# Patient Record
Sex: Female | Born: 1975 | Race: White | Hispanic: No | Marital: Married | Smoking: Never smoker
Health system: Southern US, Community
[De-identification: ages and names within clinical notes are randomized; demographics above are authoritative.]

## PROBLEM LIST (undated history)

## (undated) DIAGNOSIS — N800 Endometriosis of uterus: Secondary | ICD-10-CM

## (undated) DIAGNOSIS — M51369 Other intervertebral disc degeneration, lumbar region without mention of lumbar back pain or lower extremity pain: Secondary | ICD-10-CM

## (undated) DIAGNOSIS — J45909 Unspecified asthma, uncomplicated: Secondary | ICD-10-CM

## (undated) DIAGNOSIS — M419 Scoliosis, unspecified: Secondary | ICD-10-CM

## (undated) DIAGNOSIS — M1990 Unspecified osteoarthritis, unspecified site: Secondary | ICD-10-CM

## (undated) DIAGNOSIS — J387 Other diseases of larynx: Secondary | ICD-10-CM

## (undated) DIAGNOSIS — IMO0001 Reserved for inherently not codable concepts without codable children: Secondary | ICD-10-CM

## (undated) DIAGNOSIS — R102 Pelvic and perineal pain: Secondary | ICD-10-CM

## (undated) DIAGNOSIS — Z90710 Acquired absence of both cervix and uterus: Secondary | ICD-10-CM

## (undated) DIAGNOSIS — M5136 Other intervertebral disc degeneration, lumbar region: Secondary | ICD-10-CM

## (undated) HISTORY — PX: MICROLARYNGOSCOPY WITH CO2 LASER AND EXCISION OF VOCAL CORD LESION: SHX5970

## (undated) HISTORY — DX: Scoliosis, unspecified: M41.9

## (undated) HISTORY — PX: CHOLECYSTECTOMY: SHX55

## (undated) HISTORY — DX: Other intervertebral disc degeneration, lumbar region without mention of lumbar back pain or lower extremity pain: M51.369

## (undated) HISTORY — DX: Unspecified osteoarthritis, unspecified site: M19.90

## (undated) HISTORY — DX: Other intervertebral disc degeneration, lumbar region: M51.36

---

## 2005-09-09 DIAGNOSIS — J387 Other diseases of larynx: Secondary | ICD-10-CM

## 2005-09-09 HISTORY — DX: Other diseases of larynx: J38.7

## 2007-02-20 ENCOUNTER — Ambulatory Visit: Payer: Self-pay | Admitting: Gastroenterology

## 2007-02-20 LAB — CONVERTED CEMR LAB
ALT: 20 units/L (ref 0–40)
Albumin: 3.3 g/dL — ABNORMAL LOW (ref 3.5–5.2)
Alkaline Phosphatase: 88 units/L (ref 39–117)

## 2007-02-23 ENCOUNTER — Ambulatory Visit (HOSPITAL_COMMUNITY): Admission: RE | Admit: 2007-02-23 | Discharge: 2007-02-23 | Payer: Self-pay | Admitting: Gastroenterology

## 2008-01-23 DIAGNOSIS — Z8719 Personal history of other diseases of the digestive system: Secondary | ICD-10-CM | POA: Insufficient documentation

## 2008-01-23 DIAGNOSIS — R945 Abnormal results of liver function studies: Secondary | ICD-10-CM

## 2011-01-22 NOTE — Assessment & Plan Note (Signed)
[facility]                         GASTROENTEROLOGY OFFICE NOTE   Ebony Espinoza, Ebony Espinoza                      MRN:          [id number]  DATE:02/20/2007                            DOB:          [birth year]    CHIEF COMPLAINT:  Ebony Espinoza is referred by Dr. Dimas Aguas for evaluation  of abnormal liver enzymes.   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 Gabriel Cirri in  March of this past year.  This was done laparoscopically, but I do not  have records for review at this time.  She apparently did well after  surgery.  Had an episode of rather severe epigastric pain with nausea in  mid May, was seen by her primary care physician and had markedly  elevated liver function tests with a bilirubin of 3.0, alkaline  phosphatase 368, SGOT 315, and SGPT 699.  Also at that time she was  having pruritus, clay colored stools and dark urine.  She had ultrasound  of the abdomen performed that showed fatty infiltration of the liver and  a prominent common bile duct with no change from March 2008.  She was  treated for urinary tract infection and subsequently got better.  She  is currently asymptomatic and denies abdominal pain, nausea or vomiting,  or any GI difficulties.  She has minor itching but denies chronic  fatigue.  She has never had hepatitis or prolonged viral illness, and is  not on any hepatotoxic drugs.  She does not use over-the-counter  medications.  Her appetite is good and her weight is stable.  She denies  associated collagen vascular type symptomatology such as skin rashes,  joint pains, photosensitivity,  etc.   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   MEDICATIONS:  Include:  1. Topamax 25 mg t.i.d.  2. Necon 777 daily for  birth control.  3. A medicine called Cyclobenzaprol 10 mg as needed for migraines.   In the past has had reactions to MORPHINE.   FAMILY HISTORY:  Remarkable for diabetes, but no known gastrointestinal  problems.   SOCIAL HISTORY:  She is married, lives with her husband and 2 children,  works as a housewife.  She has a high Photographer.  She does not  smoke or abuse ethanol.  She denies illicit IV drug use or needle  exposure.   REVIEW OF SYSTEMS:  Otherwise noncontributory.  Last menstrual period  February 11, 2007.   EXAM:  She is an attractive, healthy appearing white female in no  distress.  She is hoarse from recent vocal cord surgery.  She is 5 feet 4 inches tall and weighs 207 pounds.  Blood pressure  116/74, pulse was 76 and regular.  I could not appreciate stigmata of chronic liver disease and there was  no icterus.  CHEST:  Clear and she was in a regular rhythm without murmurs, gallops  or rubs.  I could  not appreciate hepatosplenomegaly, abdominal masses or  tenderness.  Bowel sounds were normal.  Peripheral extremities were  unremarkable.  There was no edema or  phlebitis.  Mental status was clear.   ASSESSMENT:  I think Ebony Espinoza probably passed retained common bile  duct stone per her clinical picture and abnormal liver function tests.  Her symptoms certainly do not sound like chronic liver disease or acute  hepatitis.  She did have acute viral serologies drawn on Jan 28, 2007,  these were all negative.   RECOMMENDATIONS:  1. Repeat liver profile.  2. MRCP exam.  3. Further workup depending on the above clinical evaluation.     Vania Rea. Jarold Motto, MD, Caleen Essex, FAGA  Electronically Signed    DRP/MedQ  DD: 02/20/2007  DT: 02/20/2007  Job #: [id number]   cc:   Patrice Paradise, M.D.

## 2014-10-25 ENCOUNTER — Encounter (HOSPITAL_COMMUNITY)
Admission: RE | Admit: 2014-10-25 | Discharge: 2014-10-25 | Disposition: A | Discharge status: Home / Self Care | Payer: BLUE CROSS/BLUE SHIELD | Source: Ambulatory Visit | Attending: Obstetrics and Gynecology | Admitting: Obstetrics and Gynecology

## 2014-10-25 ENCOUNTER — Encounter (HOSPITAL_COMMUNITY): Payer: Self-pay

## 2014-10-25 DIAGNOSIS — N800 Endometriosis of uterus: Secondary | ICD-10-CM | POA: Insufficient documentation

## 2014-10-25 DIAGNOSIS — R102 Pelvic and perineal pain: Secondary | ICD-10-CM | POA: Diagnosis not present

## 2014-10-25 DIAGNOSIS — Z01818 Encounter for other preprocedural examination: Secondary | ICD-10-CM | POA: Insufficient documentation

## 2014-10-25 HISTORY — DX: Other diseases of larynx: J38.7

## 2014-10-25 HISTORY — DX: Reserved for inherently not codable concepts without codable children: IMO0001

## 2014-10-25 HISTORY — DX: Unspecified asthma, uncomplicated: J45.909

## 2014-10-25 LAB — COMPREHENSIVE METABOLIC PANEL
ALBUMIN: 3.8 g/dL (ref 3.5–5.2)
ALK PHOS: 63 U/L (ref 39–117)
ALT: 14 U/L (ref 0–35)
ANION GAP: 4 — AB (ref 5–15)
AST: 13 U/L (ref 0–37)
BUN: 11 mg/dL (ref 6–23)
CALCIUM: 8.9 mg/dL (ref 8.4–10.5)
CO2: 21 mmol/L (ref 19–32)
Chloride: 112 mmol/L (ref 96–112)
Creatinine, Ser: 0.79 mg/dL (ref 0.50–1.10)
GFR calc Af Amer: >90 mL/min (ref >90)
GFR calc non Af Amer: >90 mL/min (ref >90)
Glucose, Bld: 85 mg/dL (ref 70–99)
POTASSIUM: 4 mmol/L (ref 3.5–5.1)
SODIUM: 137 mmol/L (ref 135–145)
TOTAL PROTEIN: 7.9 g/dL (ref 6.0–8.3)
Total Bilirubin: 0.6 mg/dL (ref 0.3–1.2)

## 2014-10-25 LAB — CBC
HCT: 40.8 % (ref 36.0–46.0)
Hemoglobin: 13.9 g/dL (ref 12.0–15.0)
MCH: 30 pg (ref 26.0–34.0)
MCHC: 34.1 g/dL (ref 30.0–36.0)
MCV: 88.1 fL (ref 78.0–100.0)
PLATELETS: 246 10*3/uL (ref 150–400)
RBC: 4.63 MIL/uL (ref 3.87–5.11)
RDW: 12.9 % (ref 11.5–15.5)
WBC: 8.7 10*3/uL (ref 4.0–10.5)

## 2014-10-25 NOTE — Patient Instructions (Signed)
Your procedure is scheduled on:10/31/14  Enter through the Main Entrance at :8am Pick up desk phone and dial [phone] and inform us of your arrival.  Please call [phone] if you have any problems the morning of surgery.  Remember: Do not eat food or drink liquids, including water, after midnight:Sunday   You may brush your teeth the morning of surgery.  Take these meds the morning of surgery with a sip of water:bring inhaler to hospital day of surgery  DO NOT wear jewelry, eye make-up, lipstick,body lotion, or dark fingernail polish.  (Polished toes are ok) You may wear deodorant.  If you are to be admitted after surgery, leave suitcase in car until your room has been assigned. Patients discharged on the day of surgery will not be allowed to drive home. Wear loose fitting, comfortable clothes for your ride home.

## 2014-10-29 ENCOUNTER — Encounter (HOSPITAL_COMMUNITY): Payer: Self-pay | Admitting: Obstetrics and Gynecology

## 2014-10-29 DIAGNOSIS — N8000 Endometriosis of the uterus, unspecified: Secondary | ICD-10-CM

## 2014-10-29 DIAGNOSIS — N8003 Adenomyosis of the uterus: Secondary | ICD-10-CM

## 2014-10-29 DIAGNOSIS — R102 Pelvic and perineal pain: Secondary | ICD-10-CM | POA: Diagnosis present

## 2014-10-29 HISTORY — DX: Endometriosis of the uterus, unspecified: N80.00

## 2014-10-29 HISTORY — DX: Endometriosis of uterus: N80.0

## 2014-10-29 HISTORY — DX: Adenomyosis of the uterus: N80.03

## 2014-10-29 HISTORY — DX: Pelvic and perineal pain: R10.2

## 2014-10-29 NOTE — H&P (Signed)
Ebony Espinoza is an 39 y.o. female G2P2 with pelvic pain, adenomyosis on pelvic US.  H/O irregular menses.  Pt on OCPs for treatment of pain without relief.  D/W pt r/b/a of hysterectomy, inc r/b/a - pt wishes to proceed.    Pertinent Gynecological History: Menses: flow is moderate and with severe dysmenorrhea Bleeding: nl menses Contraception: OCP (estrogen/progesterone) Blood transfusions: none Sexually transmitted diseases: no past history Previous GYN Procedures: none  Last mammogram: N/A   Last pap: normal Date: 2/15 OB History: G2, P2002   Menstrual History: No LMP recorded.    Past Medical History  Diagnosis Date  . Asthma   . Shortness of breath dyspnea     related to asthma  . 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 Uterus, adenomyosis 10/29/2014  . Pelvic pain in female 10/29/2014    Past Surgical History  Procedure Laterality Date  . Cholecystectomy    . Microlaryngoscopy with co2 laser and excision of vocal cord lesion      FH: cervical dysplasia, HTN, DM, prostate CA  Social History:  reports that she has never smoked. She does not have any smokeless tobacco history on file. She reports that she does not drink alcohol. Her drug history is not on file. no drug use, married, homemake  Allergies:  Allergies  Allergen Reactions  . Morphine And Related Itching  sulfa - rash    Meds: albuterol, Qvar, Dasetta, naproxen, topamax,  Review of Systems  Constitutional: Negative.   HENT: Negative.   Eyes: Negative.   Respiratory: Negative.   Cardiovascular: Negative.   Gastrointestinal: Positive for abdominal pain.  Genitourinary:       Pelvic pain, cramping  Musculoskeletal: Negative.   Skin: Negative.   Neurological: Negative.   Psychiatric/Behavioral: Negative.     There were no vitals taken for this visit. Physical Exam  Constitutional: She is oriented to person, place, and time. She appears well-developed and  well-nourished.  HENT:  Head: Normocephalic and atraumatic.  Cardiovascular: Normal rate and regular rhythm.   Respiratory: Effort normal and breath sounds normal. No respiratory distress. She has no wheezes.  GI: Soft. Bowel sounds are normal. She exhibits no distension. There is no tenderness.  Musculoskeletal: Normal range of motion.  Neurological: She is alert and oriented to person, place, and time.  Skin: Skin is warm and dry.  Psychiatric: She has a normal mood and affect. Her behavior is normal.   US - uterus c/w adenomyosis  Assessment/Plan: 01UX [id number] for LAVH/ B salpingectomy given h/o adenomyosis - have d/w pt r/b/a of surgery Post-op pain - dilaudid PCA Ancef for antibiotic prophylaxis   Bovard-Stuckert, Trung Wenzl 10/29/2014, 5:02 PM

## 2014-10-31 ENCOUNTER — Encounter (HOSPITAL_COMMUNITY): Payer: Self-pay | Admitting: *Deleted

## 2014-10-31 ENCOUNTER — Observation Stay (HOSPITAL_COMMUNITY)
Admission: RE | Admit: 2014-10-31 | Discharge: 2014-11-01 | Disposition: A | Discharge status: Home / Self Care | Payer: BLUE CROSS/BLUE SHIELD | Source: Ambulatory Visit | Attending: Obstetrics and Gynecology | Admitting: Obstetrics and Gynecology

## 2014-10-31 ENCOUNTER — Ambulatory Visit (HOSPITAL_COMMUNITY): Payer: BLUE CROSS/BLUE SHIELD | Admitting: Anesthesiology

## 2014-10-31 ENCOUNTER — Encounter (HOSPITAL_COMMUNITY)
Admission: RE | Disposition: A | Discharge status: Home / Self Care | Payer: Self-pay | Source: Ambulatory Visit | Attending: Obstetrics and Gynecology

## 2014-10-31 DIAGNOSIS — Z87448 Personal history of other diseases of urinary system: Secondary | ICD-10-CM | POA: Diagnosis not present

## 2014-10-31 DIAGNOSIS — Z885 Allergy status to narcotic agent status: Secondary | ICD-10-CM | POA: Insufficient documentation

## 2014-10-31 DIAGNOSIS — N8000 Endometriosis of the uterus, unspecified: Secondary | ICD-10-CM | POA: Diagnosis present

## 2014-10-31 DIAGNOSIS — Z90710 Acquired absence of both cervix and uterus: Secondary | ICD-10-CM

## 2014-10-31 DIAGNOSIS — Z882 Allergy status to sulfonamides status: Secondary | ICD-10-CM | POA: Insufficient documentation

## 2014-10-31 DIAGNOSIS — Z9049 Acquired absence of other specified parts of digestive tract: Secondary | ICD-10-CM | POA: Diagnosis not present

## 2014-10-31 DIAGNOSIS — N72 Inflammatory disease of cervix uteri: Principal | ICD-10-CM | POA: Insufficient documentation

## 2014-10-31 DIAGNOSIS — J45909 Unspecified asthma, uncomplicated: Secondary | ICD-10-CM | POA: Insufficient documentation

## 2014-10-31 DIAGNOSIS — R102 Pelvic and perineal pain: Secondary | ICD-10-CM | POA: Diagnosis present

## 2014-10-31 HISTORY — DX: Acquired absence of both cervix and uterus: Z90.710

## 2014-10-31 HISTORY — PX: LAPAROSCOPIC ASSISTED VAGINAL HYSTERECTOMY: SHX5398

## 2014-10-31 HISTORY — PX: BILATERAL SALPINGECTOMY: SHX5743

## 2014-10-31 HISTORY — DX: Endometriosis of uterus: N80.0

## 2014-10-31 HISTORY — DX: Pelvic and perineal pain: R10.2

## 2014-10-31 LAB — PREGNANCY, URINE: Preg Test, Ur: NEGATIVE

## 2014-10-31 SURGERY — HYSTERECTOMY, VAGINAL, LAPAROSCOPY-ASSISTED
Anesthesia: General | Site: Abdomen

## 2014-10-31 MED ORDER — LACTATED RINGERS IV SOLN
INTRAVENOUS | Status: DC
  Administered 2014-10-31 – 2014-11-01 (×3): via INTRAVENOUS

## 2014-10-31 MED ORDER — FENTANYL CITRATE 0.05 MG/ML IJ SOLN
INTRAMUSCULAR | Status: AC
  Filled 2014-10-31: qty 2

## 2014-10-31 MED ORDER — VASOPRESSIN 20 UNIT/ML IV SOLN
INTRAVENOUS | Status: AC
  Filled 2014-10-31: qty 1

## 2014-10-31 MED ORDER — NALBUPHINE HCL 10 MG/ML IJ SOLN
INTRAMUSCULAR | Status: AC
  Administered 2014-10-31: 2.5 mg via INTRAVENOUS
  Filled 2014-10-31: qty 1

## 2014-10-31 MED ORDER — NALBUPHINE HCL 10 MG/ML IJ SOLN
2.5000 mg | INTRAMUSCULAR | Status: AC
  Administered 2014-10-31 (×2): 2.5 mg via INTRAVENOUS
  Filled 2014-10-31 (×2): qty 1

## 2014-10-31 MED ORDER — ONDANSETRON HCL 4 MG/2ML IJ SOLN: 4.0000 mg | Freq: Four times a day (QID) | INTRAMUSCULAR | Status: DC | PRN

## 2014-10-31 MED ORDER — FENTANYL CITRATE 0.05 MG/ML IJ SOLN
INTRAMUSCULAR | Status: AC
  Filled 2014-10-31: qty 5

## 2014-10-31 MED ORDER — DIPHENHYDRAMINE HCL 50 MG/ML IJ SOLN
INTRAMUSCULAR | Status: AC
  Administered 2014-10-31: 12.5 mg via INTRAVENOUS
  Filled 2014-10-31: qty 1

## 2014-10-31 MED ORDER — NEOSTIGMINE METHYLSULFATE 10 MG/10ML IV SOLN
INTRAVENOUS | Status: AC
  Filled 2014-10-31: qty 1

## 2014-10-31 MED ORDER — FENTANYL CITRATE 0.05 MG/ML IJ SOLN
25.0000 ug | INTRAMUSCULAR | Status: DC | PRN
  Administered 2014-10-31 (×4): 50 ug via INTRAVENOUS

## 2014-10-31 MED ORDER — ALBUTEROL SULFATE HFA 108 (90 BASE) MCG/ACT IN AERS: 2.0000 | INHALATION_SPRAY | Freq: Four times a day (QID) | RESPIRATORY_TRACT | Status: DC | PRN

## 2014-10-31 MED ORDER — LACTATED RINGERS IR SOLN
Status: DC | PRN
  Administered 2014-10-31: 3000 mL

## 2014-10-31 MED ORDER — DIPHENHYDRAMINE HCL 50 MG/ML IJ SOLN
12.5000 mg | Freq: Four times a day (QID) | INTRAMUSCULAR | Status: DC | PRN
  Administered 2014-10-31: 12.5 mg via INTRAVENOUS

## 2014-10-31 MED ORDER — 0.9 % SODIUM CHLORIDE (POUR BTL) OPTIME
TOPICAL | Status: DC | PRN
  Administered 2014-10-31: 1000 mL

## 2014-10-31 MED ORDER — SODIUM CHLORIDE 0.9 % IJ SOLN: 9.0000 mL | INTRAMUSCULAR | Status: DC | PRN

## 2014-10-31 MED ORDER — MIDAZOLAM HCL 2 MG/2ML IJ SOLN
INTRAMUSCULAR | Status: AC
  Filled 2014-10-31: qty 2

## 2014-10-31 MED ORDER — ROCURONIUM BROMIDE 100 MG/10ML IV SOLN
INTRAVENOUS | Status: DC | PRN
  Administered 2014-10-31: 5 mg via INTRAVENOUS
  Administered 2014-10-31: 40 mg via INTRAVENOUS
  Administered 2014-10-31: 5 mg via INTRAVENOUS

## 2014-10-31 MED ORDER — HYDROMORPHONE 0.3 MG/ML IV SOLN
INTRAVENOUS | Status: DC
  Administered 2014-10-31: 1.19 mg via INTRAVENOUS
  Administered 2014-10-31: 13:00:00 via INTRAVENOUS
  Administered 2014-10-31: 0.2 mg via INTRAVENOUS
  Administered 2014-10-31 – 2014-11-01 (×2): 1.19 mg via INTRAVENOUS
  Filled 2014-10-31: qty 25

## 2014-10-31 MED ORDER — ONDANSETRON HCL 4 MG/2ML IJ SOLN
INTRAMUSCULAR | Status: AC
  Filled 2014-10-31: qty 2

## 2014-10-31 MED ORDER — HYDROMORPHONE HCL 1 MG/ML IJ SOLN
INTRAMUSCULAR | Status: AC
  Filled 2014-10-31: qty 1

## 2014-10-31 MED ORDER — DEXAMETHASONE SODIUM PHOSPHATE 10 MG/ML IJ SOLN
INTRAMUSCULAR | Status: AC
  Filled 2014-10-31: qty 1

## 2014-10-31 MED ORDER — DEXAMETHASONE SODIUM PHOSPHATE 10 MG/ML IJ SOLN
INTRAMUSCULAR | Status: DC | PRN
  Administered 2014-10-31: 10 mg via INTRAVENOUS

## 2014-10-31 MED ORDER — ALBUTEROL SULFATE (2.5 MG/3ML) 0.083% IN NEBU: 2.5000 mg | INHALATION_SOLUTION | Freq: Four times a day (QID) | RESPIRATORY_TRACT | Status: DC | PRN

## 2014-10-31 MED ORDER — VASOPRESSIN 20 UNIT/ML IV SOLN
INTRAVENOUS | Status: DC | PRN
  Administered 2014-10-31: 20 mL via INTRAMUSCULAR

## 2014-10-31 MED ORDER — GLYCOPYRROLATE 0.2 MG/ML IJ SOLN
INTRAMUSCULAR | Status: AC
  Filled 2014-10-31: qty 3

## 2014-10-31 MED ORDER — LIDOCAINE HCL (CARDIAC) 20 MG/ML IV SOLN
INTRAVENOUS | Status: AC
  Filled 2014-10-31: qty 5

## 2014-10-31 MED ORDER — BUPIVACAINE HCL (PF) 0.25 % IJ SOLN
INTRAMUSCULAR | Status: AC
  Filled 2014-10-31: qty 30

## 2014-10-31 MED ORDER — NEOSTIGMINE METHYLSULFATE 10 MG/10ML IV SOLN
INTRAVENOUS | Status: DC | PRN
  Administered 2014-10-31: 3 mg via INTRAVENOUS

## 2014-10-31 MED ORDER — SCOPOLAMINE 1 MG/3DAYS TD PT72
MEDICATED_PATCH | TRANSDERMAL | Status: AC
  Administered 2014-10-31: 1.5 mg via TRANSDERMAL
  Filled 2014-10-31: qty 1

## 2014-10-31 MED ORDER — LACTATED RINGERS IV SOLN: INTRAVENOUS | Status: DC

## 2014-10-31 MED ORDER — KETOROLAC TROMETHAMINE 30 MG/ML IJ SOLN
INTRAMUSCULAR | Status: AC
  Filled 2014-10-31: qty 1

## 2014-10-31 MED ORDER — PROPOFOL 10 MG/ML IV BOLUS
INTRAVENOUS | Status: DC | PRN
  Administered 2014-10-31: 150 mg via INTRAVENOUS
  Administered 2014-10-31: 50 mg via INTRAVENOUS

## 2014-10-31 MED ORDER — SCOPOLAMINE 1 MG/3DAYS TD PT72
1.0000 | MEDICATED_PATCH | Freq: Once | TRANSDERMAL | Status: DC
  Administered 2014-10-31: 1.5 mg via TRANSDERMAL

## 2014-10-31 MED ORDER — LIDOCAINE HCL (CARDIAC) 20 MG/ML IV SOLN
INTRAVENOUS | Status: DC | PRN
  Administered 2014-10-31: 80 mg via INTRAVENOUS

## 2014-10-31 MED ORDER — HYDROMORPHONE HCL 1 MG/ML IJ SOLN
INTRAMUSCULAR | Status: DC | PRN
  Administered 2014-10-31 (×2): 0.5 mg via INTRAVENOUS

## 2014-10-31 MED ORDER — MIDAZOLAM HCL 2 MG/2ML IJ SOLN
INTRAMUSCULAR | Status: DC | PRN
  Administered 2014-10-31: 2 mg via INTRAVENOUS

## 2014-10-31 MED ORDER — GLYCOPYRROLATE 0.2 MG/ML IJ SOLN
INTRAMUSCULAR | Status: DC | PRN
  Administered 2014-10-31: 0.6 mg via INTRAVENOUS

## 2014-10-31 MED ORDER — CEFAZOLIN SODIUM-DEXTROSE 2-3 GM-% IV SOLR
2.0000 g | INTRAVENOUS | Status: AC
  Administered 2014-10-31: 2 g via INTRAVENOUS

## 2014-10-31 MED ORDER — ACETAMINOPHEN 160 MG/5ML PO SOLN
1000.0000 mg | Freq: Four times a day (QID) | ORAL | Status: DC | PRN
  Administered 2014-10-31: 1000 mg via ORAL

## 2014-10-31 MED ORDER — SODIUM CHLORIDE 0.9 % IJ SOLN
INTRAMUSCULAR | Status: AC
  Filled 2014-10-31: qty 100

## 2014-10-31 MED ORDER — FLUTICASONE PROPIONATE HFA 44 MCG/ACT IN AERO
2.0000 | INHALATION_SPRAY | Freq: Two times a day (BID) | RESPIRATORY_TRACT | Status: DC
  Administered 2014-10-31 – 2014-11-01 (×2): 2 via RESPIRATORY_TRACT
  Filled 2014-10-31: qty 10.6

## 2014-10-31 MED ORDER — NALOXONE HCL 0.4 MG/ML IJ SOLN: 0.4000 mg | INTRAMUSCULAR | Status: DC | PRN

## 2014-10-31 MED ORDER — ONDANSETRON HCL 4 MG/2ML IJ SOLN
INTRAMUSCULAR | Status: DC | PRN
  Administered 2014-10-31: 4 mg via INTRAVENOUS

## 2014-10-31 MED ORDER — GUAIFENESIN 100 MG/5ML PO SOLN: 15.0000 mL | ORAL | Status: DC | PRN

## 2014-10-31 MED ORDER — ONDANSETRON HCL 4 MG PO TABS: 4.0000 mg | ORAL_TABLET | Freq: Four times a day (QID) | ORAL | Status: DC | PRN

## 2014-10-31 MED ORDER — DIPHENHYDRAMINE HCL 12.5 MG/5ML PO ELIX
12.5000 mg | ORAL_SOLUTION | Freq: Four times a day (QID) | ORAL | Status: DC | PRN
  Administered 2014-11-01: 12.5 mg via ORAL
  Filled 2014-10-31 (×2): qty 5

## 2014-10-31 MED ORDER — PROPOFOL 10 MG/ML IV BOLUS
INTRAVENOUS | Status: AC
  Filled 2014-10-31: qty 40

## 2014-10-31 MED ORDER — CETYLPYRIDINIUM CHLORIDE 0.05 % MT LIQD
7.0000 mL | Freq: Two times a day (BID) | OROMUCOSAL | Status: DC
  Administered 2014-10-31 – 2014-11-01 (×3): 7 mL via OROMUCOSAL

## 2014-10-31 MED ORDER — KETOROLAC TROMETHAMINE 30 MG/ML IJ SOLN
INTRAMUSCULAR | Status: DC | PRN
  Administered 2014-10-31: 30 mg via INTRAVENOUS

## 2014-10-31 MED ORDER — PNEUMOCOCCAL VAC POLYVALENT 25 MCG/0.5ML IJ INJ
0.5000 mL | INJECTION | INTRAMUSCULAR | Status: AC
Start: 2014-11-01 — End: 2014-11-01
  Administered 2014-11-01: 0.5 mL via INTRAMUSCULAR
  Filled 2014-10-31: qty 0.5

## 2014-10-31 MED ORDER — OXYCODONE-ACETAMINOPHEN 5-325 MG PO TABS
1.0000 | ORAL_TABLET | ORAL | Status: DC | PRN
  Administered 2014-11-01 (×2): 1 via ORAL
  Filled 2014-10-31 (×2): qty 1

## 2014-10-31 MED ORDER — NALBUPHINE HCL 10 MG/ML IJ SOLN
2.5000 mg | INTRAMUSCULAR | Status: DC
  Administered 2014-10-31 (×2): 2.5 mg via INTRAVENOUS

## 2014-10-31 MED ORDER — IBUPROFEN 800 MG PO TABS
800.0000 mg | ORAL_TABLET | Freq: Three times a day (TID) | ORAL | Status: DC | PRN
  Administered 2014-10-31 – 2014-11-01 (×2): 800 mg via ORAL
  Filled 2014-10-31 (×2): qty 1

## 2014-10-31 MED ORDER — MENTHOL 3 MG MT LOZG
1.0000 | LOZENGE | OROMUCOSAL | Status: DC | PRN
  Administered 2014-10-31: 3 mg via ORAL
  Filled 2014-10-31: qty 9

## 2014-10-31 MED ORDER — TOPIRAMATE 25 MG PO TABS
50.0000 mg | ORAL_TABLET | Freq: Two times a day (BID) | ORAL | Status: DC
  Administered 2014-10-31 – 2014-11-01 (×2): 50 mg via ORAL
  Filled 2014-10-31 (×3): qty 2

## 2014-10-31 MED ORDER — ACETAMINOPHEN 160 MG/5ML PO SOLN
ORAL | Status: AC
  Administered 2014-10-31: 1000 mg via ORAL
  Filled 2014-10-31: qty 40.6

## 2014-10-31 MED ORDER — LACTATED RINGERS IV SOLN
INTRAVENOUS | Status: DC
  Administered 2014-10-31 (×3): via INTRAVENOUS

## 2014-10-31 MED ORDER — ROCURONIUM BROMIDE 100 MG/10ML IV SOLN
INTRAVENOUS | Status: AC
  Filled 2014-10-31: qty 1

## 2014-10-31 MED ORDER — SIMETHICONE 80 MG PO CHEW: 80.0000 mg | CHEWABLE_TABLET | Freq: Four times a day (QID) | ORAL | Status: DC | PRN

## 2014-10-31 MED ORDER — FENTANYL CITRATE 0.05 MG/ML IJ SOLN
INTRAMUSCULAR | Status: DC | PRN
  Administered 2014-10-31 (×5): 50 ug via INTRAVENOUS
  Administered 2014-10-31: 100 ug via INTRAVENOUS

## 2014-10-31 MED ORDER — ALUM & MAG HYDROXIDE-SIMETH 200-200-20 MG/5ML PO SUSP: 30.0000 mL | ORAL | Status: DC | PRN

## 2014-10-31 MED ORDER — CEFAZOLIN SODIUM-DEXTROSE 2-3 GM-% IV SOLR
INTRAVENOUS | Status: AC
  Filled 2014-10-31: qty 50

## 2014-10-31 MED ORDER — FAMOTIDINE 20 MG PO TABS
20.0000 mg | ORAL_TABLET | Freq: Once | ORAL | Status: AC
  Administered 2014-10-31: 20 mg via ORAL

## 2014-10-31 MED ORDER — FAMOTIDINE 20 MG PO TABS
ORAL_TABLET | ORAL | Status: AC
  Administered 2014-10-31: 20 mg via ORAL
  Filled 2014-10-31: qty 1

## 2014-10-31 SURGICAL SUPPLY — 44 items
CABLE HIGH FREQUENCY MONO STRZ (ELECTRODE) IMPLANT
CLOSURE WOUND 1/4 X3 (GAUZE/BANDAGES/DRESSINGS)
CLOTH BEACON ORANGE TIMEOUT ST (SAFETY) ×4 IMPLANT
CONT PATH 16OZ SNAP LID 3702 (MISCELLANEOUS) ×4 IMPLANT
COVER BACK TABLE 60X90IN (DRAPES) ×4 IMPLANT
COVER LIGHT HANDLE  1/PK (MISCELLANEOUS) ×4
COVER LIGHT HANDLE 1/PK (MISCELLANEOUS) ×4 IMPLANT
DECANTER SPIKE VIAL GLASS SM (MISCELLANEOUS) ×4 IMPLANT
DRESSING OPSITE X SMALL 2X3 (GAUZE/BANDAGES/DRESSINGS) ×4 IMPLANT
DRSG COVADERM PLUS 2X2 (GAUZE/BANDAGES/DRESSINGS) ×8 IMPLANT
DRSG OPSITE POSTOP 3X4 (GAUZE/BANDAGES/DRESSINGS) IMPLANT
DURAPREP 26ML APPLICATOR (WOUND CARE) ×4 IMPLANT
ELECT REM PT RETURN 9FT ADLT (ELECTROSURGICAL) ×4
ELECTRODE REM PT RTRN 9FT ADLT (ELECTROSURGICAL) ×2 IMPLANT
EVACUATOR PREFILTER SMOKE (MISCELLANEOUS) ×4 IMPLANT
GLOVE BIO SURGEON STRL SZ 6.5 (GLOVE) ×3 IMPLANT
GLOVE BIO SURGEONS STRL SZ 6.5 (GLOVE) ×1
GLOVE BIOGEL PI IND STRL 7.0 (GLOVE) ×4 IMPLANT
GLOVE BIOGEL PI INDICATOR 7.0 (GLOVE) ×4
LIQUID BAND (GAUZE/BANDAGES/DRESSINGS) ×4 IMPLANT
NEEDLE INSUFFLATION 120MM (ENDOMECHANICALS) ×4 IMPLANT
NS IRRIG 1000ML POUR BTL (IV SOLUTION) ×4 IMPLANT
PACK LAVH (CUSTOM PROCEDURE TRAY) ×4 IMPLANT
PACK ROBOTIC GOWN (GOWN DISPOSABLE) ×4 IMPLANT
PAD POSITIONER PINK NONSTERILE (MISCELLANEOUS) ×4 IMPLANT
PROTECTOR NERVE ULNAR (MISCELLANEOUS) IMPLANT
SET CYSTO W/LG BORE CLAMP LF (SET/KITS/TRAYS/PACK) IMPLANT
SET IRRIG TUBING LAPAROSCOPIC (IRRIGATION / IRRIGATOR) IMPLANT
SET TRI-LUMEN FLTR TB AIRSEAL (TUBING) ×4 IMPLANT
SHEARS HARMONIC ACE PLUS 36CM (ENDOMECHANICALS) ×4 IMPLANT
STRIP CLOSURE SKIN 1/4X3 (GAUZE/BANDAGES/DRESSINGS) IMPLANT
SUT VIC AB 1 CT1 18XBRD ANBCTR (SUTURE) ×4 IMPLANT
SUT VIC AB 1 CT1 8-18 (SUTURE) ×8
SUT VIC AB 2-0 CT1 (SUTURE) ×4 IMPLANT
SUT VICRYL 0 TIES 12 18 (SUTURE) ×4 IMPLANT
SUT VICRYL 0 UR6 27IN ABS (SUTURE) ×4 IMPLANT
SUT VICRYL 4-0 PS2 18IN ABS (SUTURE) ×4 IMPLANT
SYR 3ML 23GX1 SAFETY (SYRINGE) ×4 IMPLANT
TOWEL OR 17X24 6PK STRL BLUE (TOWEL DISPOSABLE) ×16 IMPLANT
TRAY FOLEY CATH 14FR (SET/KITS/TRAYS/PACK) ×4 IMPLANT
TROCAR PORT AIRSEAL 5X120 (TROCAR) ×4 IMPLANT
TROCAR XCEL NON-BLD 5MMX100MML (ENDOMECHANICALS) ×12 IMPLANT
WARMER LAPAROSCOPE (MISCELLANEOUS) ×4 IMPLANT
WATER STERILE IRR 1000ML POUR (IV SOLUTION) ×4 IMPLANT

## 2014-10-31 NOTE — Interval H&P Note (Signed)
History and Physical Interval Note:  10/31/2014 8:40 AM  Ebony Espinoza  has presented today for surgery, with the diagnosis of Pelvic Pain, Adenomyosis,   The various methods of treatment have been discussed with the patient and family. After consideration of risks, benefits and other options for treatment, the patient has consented to  Procedure(s): LAPAROSCOPIC ASSISTED VAGINAL HYSTERECTOMY (N/A) BILATERAL SALPINGECTOMY (Bilateral) as a surgical intervention .  The patient's history has been reviewed, patient examined, no change in status, stable for surgery.  I have reviewed the patient's chart and labs.  Questions were answered to the patient's satisfaction.     Bovard-Stuckert, Yulia Ulrich

## 2014-10-31 NOTE — Progress Notes (Signed)
Day of Surgery Procedure(s) (LRB): LAPAROSCOPIC ASSISTED VAGINAL HYSTERECTOMY (N/A) BILATERAL SALPINGECTOMY (Bilateral)  Subjective: Patient reports tolerating PO.  Some pain, but controlling with PCA  Objective: I have reviewed patient's vital signs and intake and output.  General: alert and cooperative GI: soft NT  Assessment: s/p Procedure(s) with comments: LAPAROSCOPIC ASSISTED VAGINAL HYSTERECTOMY (N/A) - vaginal  BILATERAL SALPINGECTOMY (Bilateral) - vaginal: stable  Plan: Doing well immediately post-op Tolerating po well.     Oliver Pila 10/31/2014, 6:43 PM

## 2014-10-31 NOTE — Anesthesia Preprocedure Evaluation (Signed)
Anesthesia Evaluation  Patient identified by MRN, date of birth, ID band Patient awake    Reviewed: Allergy & Precautions, H&P , Patient's Chart, lab work & pertinent test results, reviewed documented beta blocker date and time   Airway Mallampati: II  TM Distance: >3 FB Neck ROM: full    Dental no notable dental hx.    Pulmonary asthma ,  breath sounds clear to auscultation  Pulmonary exam normal       Cardiovascular Rhythm:regular Rate:Normal     Neuro/Psych    GI/Hepatic   Endo/Other    Renal/GU      Musculoskeletal   Abdominal   Peds  Hematology   Anesthesia Other Findings   Reproductive/Obstetrics                             Anesthesia Physical Anesthesia Plan  ASA: II  Anesthesia Plan: General   Post-op Pain Management:    Induction: Intravenous  Airway Management Planned: Oral ETT  Additional Equipment:   Intra-op Plan:   Post-operative Plan: Extubation in OR  Informed Consent: I have reviewed the patients History and Physical, chart, labs and discussed the procedure including the risks, benefits and alternatives for the proposed anesthesia with the patient or authorized representative who has indicated his/her understanding and acceptance.   Dental Advisory Given and Dental advisory given  Plan Discussed with: CRNA and Surgeon  Anesthesia Plan Comments: (Will use slightly smaller ETT  Discussed general anesthesia, including possible nausea, instrumentation of airway, sore throat,pulmonary aspiration, etc. I asked if the were any outstanding questions, or  concerns before we proceeded. )        Anesthesia Quick Evaluation

## 2014-10-31 NOTE — Brief Op Note (Signed)
10/31/2014  10:59 AM  PATIENT:  Ebony Espinoza  39 y.o. female  PRE-OPERATIVE DIAGNOSIS:  Pelvic Pain, Adenomyosis,   POST-OPERATIVE DIAGNOSIS:  Pelvic Pain, Adenomyosis,   PROCEDURE:  Procedure(s) with comments: LAPAROSCOPIC ASSISTED VAGINAL HYSTERECTOMY (N/A) - vaginal  BILATERAL SALPINGECTOMY (Bilateral) - vaginal  SURGEON:  Surgeon(s) and Role:    * Sherian Rein, MD - Primary    * Lavina Hamman, MD - Assisting  ANESTHESIA:   local and general  EBL:  Total I/O In: 2000 [I.V.:2000] Out: 350 [Urine:150; Blood:200]  BLOOD ADMINISTERED:none  DRAINS: Urinary Catheter (Foley)   LOCAL MEDICATIONS USED:  MARCAINE     SPECIMEN:  Source of Specimen:  uterus, cervix, B tubes  DISPOSITION OF SPECIMEN:  PATHOLOGY  COUNTS:  YES  TOURNIQUET:  * No tourniquets in log *  DICTATION: .Other Dictation: Dictation Number [id number]  PLAN OF CARE: Admit for overnight observation  PATIENT DISPOSITION:  PACU - hemodynamically stable.   Delay start of Pharmacological VTE agent (>24hrs) due to surgical blood loss or risk of bleeding: not applicable

## 2014-10-31 NOTE — Anesthesia Postprocedure Evaluation (Signed)
  Anesthesia Post-op Note  Patient: Ebony Espinoza  Procedure(s) Performed: Procedure(s) with comments: LAPAROSCOPIC ASSISTED VAGINAL HYSTERECTOMY (N/A) - vaginal  BILATERAL SALPINGECTOMY (Bilateral) - vaginal Patient is awake and responsive. Pain and nausea are reasonably well controlled. Vital signs are stable and clinically acceptable. Oxygen saturation is clinically acceptable. There are no apparent anesthetic complications at this time. Patient is ready for discharge.

## 2014-10-31 NOTE — Anesthesia Postprocedure Evaluation (Signed)
  Anesthesia Post-op Note  Patient: Ebony Espinoza  Procedure(s) Performed: Procedure(s) with comments: LAPAROSCOPIC ASSISTED VAGINAL HYSTERECTOMY (N/A) - vaginal  BILATERAL SALPINGECTOMY (Bilateral) - vaginal  Patient Location: PACU and Women's Unit  Anesthesia Type:General  Level of Consciousness: awake, alert , oriented and patient cooperative  Airway and Oxygen Therapy: Patient Spontanous Breathing  Post-op Pain: none  Post-op Assessment: Post-op Vital signs reviewed, Patient's Cardiovascular Status Stable, Respiratory Function Stable, Patent Airway, No signs of Nausea or vomiting, Adequate PO intake, Pain level controlled, No headache, No backache, No residual numbness and No residual motor weakness  Post-op Vital Signs: Reviewed and stable  Last Vitals:  Filed Vitals:   10/31/14 1550  BP: 121/62  Pulse: 68  Temp: 36.4 C  Resp: 15    Complications: No apparent anesthesia complications

## 2014-10-31 NOTE — Transfer of Care (Signed)
Immediate Anesthesia Transfer of Care Note  Patient: Josetta Huddle  Procedure(s) Performed: Procedure(s) with comments: LAPAROSCOPIC ASSISTED VAGINAL HYSTERECTOMY (N/A) - vaginal  BILATERAL SALPINGECTOMY (Bilateral) - vaginal  Patient Location: PACU  Anesthesia Type:General  Level of Consciousness: awake, alert  and patient cooperative  Airway & Oxygen Therapy: Patient Spontanous Breathing and Patient connected to nasal cannula oxygen  Post-op Assessment: Report given to RN and Post -op Vital signs reviewed and stable  Post vital signs: Reviewed and stable  Last Vitals:  Filed Vitals:   10/31/14 0755  BP: 122/59  Pulse: 62  Temp: 36.7 C  Resp: 16    Complications: No apparent anesthesia complications

## 2014-10-31 NOTE — Progress Notes (Signed)
Report called at 1249 to Arnold Long, RN.

## 2014-10-31 NOTE — Addendum Note (Signed)
Addendum  created 10/31/14 1707 by Orlie Pollen, CRNA   Modules edited: Notes Section   Notes Section:  File: [id number]

## 2014-11-01 ENCOUNTER — Encounter (HOSPITAL_COMMUNITY): Payer: Self-pay | Admitting: Obstetrics and Gynecology

## 2014-11-01 DIAGNOSIS — N72 Inflammatory disease of cervix uteri: Secondary | ICD-10-CM | POA: Diagnosis not present

## 2014-11-01 LAB — CBC
HCT: 31.5 % — ABNORMAL LOW (ref 36.0–46.0)
Hemoglobin: 10.9 g/dL — ABNORMAL LOW (ref 12.0–15.0)
MCH: 30.5 pg (ref 26.0–34.0)
MCHC: 34.6 g/dL (ref 30.0–36.0)
MCV: 88.2 fL (ref 78.0–100.0)
PLATELETS: 197 10*3/uL (ref 150–400)
RBC: 3.57 MIL/uL — ABNORMAL LOW (ref 3.87–5.11)
RDW: 12.9 % (ref 11.5–15.5)
WBC: 12.9 10*3/uL — ABNORMAL HIGH (ref 4.0–10.5)

## 2014-11-01 LAB — BASIC METABOLIC PANEL
Anion gap: 4 — ABNORMAL LOW (ref 5–15)
BUN: 7 mg/dL (ref 6–23)
CALCIUM: 8.3 mg/dL — AB (ref 8.4–10.5)
CO2: 21 mmol/L (ref 19–32)
CREATININE: 0.69 mg/dL (ref 0.50–1.10)
Chloride: 111 mmol/L (ref 96–112)
GFR calc Af Amer: >90 mL/min (ref >90)
Glucose, Bld: 113 mg/dL — ABNORMAL HIGH (ref 70–99)
Potassium: 3.7 mmol/L (ref 3.5–5.1)
Sodium: 136 mmol/L (ref 135–145)

## 2014-11-01 MED ORDER — IBUPROFEN 800 MG PO TABS: 800.0000 mg | ORAL_TABLET | Freq: Three times a day (TID) | ORAL | Status: AC | PRN

## 2014-11-01 MED ORDER — OXYCODONE-ACETAMINOPHEN 5-325 MG PO TABS: 1.0000 | ORAL_TABLET | Freq: Four times a day (QID) | ORAL | Status: DC | PRN

## 2014-11-01 NOTE — Progress Notes (Signed)
1 Day Post-Op Procedure(s) (LRB): LAPAROSCOPIC ASSISTED VAGINAL HYSTERECTOMY (N/A) BILATERAL SALPINGECTOMY (Bilateral)  Subjective: Patient reports nausea and tolerating PO.  Pain controlled    Objective: I have reviewed patient's vital signs, intake and output and labs.  General: alert and no distress Resp: clear to auscultation bilaterally Cardio: regular rate and rhythm GI: soft, non-tender; bowel sounds normal; no masses,  no organomegaly and incision: clean, dry and intact Extremities: no edema, redness or tenderness in the calves or thighs  Assessment: s/p Procedure(s) with comments: LAPAROSCOPIC ASSISTED VAGINAL HYSTERECTOMY (N/A) - vaginal  BILATERAL SALPINGECTOMY (Bilateral) - vaginal: stable and progressing well  Plan: Encourage ambulation Advance to PO medication Discontinue IV fluids Discharge home when ambulating, voiding and pain controlled.  F/u 2 wks     Bovard-Stuckert, Onda Kattner 11/01/2014, 6:37 AM

## 2014-11-01 NOTE — Op Note (Signed)
Ebony Espinoza, Ebony Espinoza             ACCOUNT NO.:  [id number]  MEDICAL RECORD NO.:  [id number]  LOCATION:  8366                          FACILITY:  [facility]  PHYSICIAN:  Thornell Sartorius, MD        DATE OF BIRTH:  1976/02/07  DATE OF PROCEDURE:  10/31/2014 DATE OF DISCHARGE:                              OPERATIVE REPORT   PREOPERATIVE DIAGNOSIS:  Pelvic pain, adenomyosis.  POSTOPERATIVE DIAGNOSIS:  Pelvic pain, adenomyosis.  PROCEDURE:  Laparoscopic-assisted vaginal hysterectomy with bilateral salpingectomy.  SURGEON:  Thornell Sartorius, MD.  ASSISTANT:  Clarene Duke, M.D.  ANESTHESIA:  Local and general.  EBL:  Approximately 200 mL.  IV FLUIDS:  2000 mL.  URINE OUTPUT:  150 mL of clear urine at the end of the procedure.  COMPLICATIONS:  None.  PATHOLOGY:  Uterus, cervix, and bilateral tubes to Pathology.  DESCRIPTION OF PROCEDURE:  After informed consent was again reviewed with the patient including risks, benefits, and alternatives of the surgical procedure, she was transported to the operating room, placed on the table in supine position.  General anesthesia was induced and found be adequate.  She was then placed in the [place].  Prepped and draped in the normal sterile fashion.  A Foley catheter was sterilely placed.  Using an open-sided speculum, a Hulka manipulator was placed in her uterus.  Gloves and gown were changed.  Attention was turned to the abdominal portion of the case and approximately 1 cm infraumbilical incision was made.  Using a Veress needle, the peritoneum was entered. After confirmation that the pneumoperitoneum was entered with a hanging drop test, the abdomen was insufflated with the AirSeal technology.  The patient was placed in Trendelenburg and a brief pelvic survey revealed normal-appearing uterus and tubes.  Attention was turned to placing the accessory ports initially on the right and then on the left.  5 mm accessory ports were placed  under direct visualization.  The tube was grasped on the left side and the Harmonic scalpel was used to the skin this from the edge of the mesosalpinx to the level of the round ligament.  These were incised with the Harmonic scalpel as were the cardinal ligaments to the level of the uterine ovarian artery.  A bladder flap was created.  Attention was turned to the right side, which in a similar fashion, the tube was held out and the tube was skinned from the mesosalpinx to the level of the round ligament.  The cardinal ligaments were dissected and noted to be hemostatic at the level of utero-ovarian artery.  The bladder flap was met with the bladder flaps from the other side.  Attention was turned to the vaginal portion of the case.  A heavy weighted speculum and a retractor were used to easily identify the cervix.  The cervix was grasped with Ardis Hughs tenaculum, and vasopressor was placed.  The uterus was then circumscribed with Bovie cautery.  An attempt was made to enter anteriorly, this was unsuccessful.  Posterior cul-de-sac was then entered and banano speculum was placed.  The uterosacral ligaments were plicated and held bilaterally.  Additional bites were taken to get to the level of the uterine artery.  This was incised and had noted to be hemostatic. Several areas of bleeding on these pedicles were made hemostatic with additional stitches.  The uterus was delivered with the tubes, this was sent to Pathology.  The cuff was made hemostatic.  The  Heavy weighted  speculum was replaced with a heavy-weighted speculum.  The posterior cuff was ran and held.  The uterosacral ligaments were plicated and tied together.  The sutures that had been held were also tied together and held.  The uterine cuff was closed with 2-0 Vicryl in a running locked fashion.  The gloves and gown were changed.  Attention was turned to the upper portion of the case.  The peritoneal cavity was re-insufflated  and inspection of the cuff found it to be hemostatic.  Small area of bleeding at the right ovary was now made hemostatic with Harmonic. Suction irrigator was used to help with visualization.  The accessory ports were removed under direct visualization, and the ports were removed and noted to be hemostatic.  The 5 mm ports were closed with a deep stitch of 3-0 Vicryl.  Dermabond was also applied.  The patient tolerated the procedure well.  Sponge, lap, and needle count was correct x2 per the operating staff.     Thornell Sartorius, MD     JB/MEDQ  D:  10/31/2014  T:  11/01/2014  Job:  [id number]

## 2014-11-01 NOTE — Progress Notes (Signed)
Pt verbalizes understanding of d/c instructions, medications, follow up appts, when to seek medical attention and belongings policy. Pt has no questions at this time. Pt has copy of d/c instructions, Recovering from Surgery Book, and prescription. Pt is currently waiting for her husband who will be driving her home. Sheryn Bison

## 2014-11-01 NOTE — Progress Notes (Signed)
Pt d/c to main entrance, accompanied by NT. Pts husband will be driving her home. Sheryn Bison

## 2014-11-01 NOTE — Discharge Summary (Signed)
Physician Discharge Summary  Patient ID: Ebony Espinoza MRN: [id number] DOB/AGE: September 26, 1975 39 y.o.  Admit date: 10/31/2014 Discharge date: 11/01/2014  Admission Diagnoses: adenomyosis, pelvic pain  Discharge Diagnoses:  Principal Problem:   S/P laparoscopic assisted vaginal hysterectomy (LAVH) Active Problems:   Uterus, adenomyosis   Pelvic pain in female   Discharged Condition: good  Hospital Course: admitted 2/22 for LAVH, B salpingectomy, surgery without complication, d/c to home POD#1 with motrin, percocet.  F/u 2 weeks and 6 weeks. At time of discharge: ambulating, voinding, pain controlled.     Consults: None  Significant Diagnostic Studies: labs: CBC, BMP  Treatments: surgery: LAVH, B salpingectomy  Discharge Exam: Blood pressure 112/61, pulse 50, temperature 97.9 F (36.6 C), temperature source Oral, resp. rate 15, height  (1.651 m), weight 79.833 kg (176 lb), SpO2 99 %. General appearance: alert and no distress Resp: clear to auscultation bilaterally Cardio: regular rate and rhythm GI: soft, non-tender; bowel sounds normal; no masses,  no organomegaly Extremities: extremities normal, atraumatic, no cyanosis or edema Incision/Wound:C/D/I  Disposition: Final discharge disposition not confirmed  Discharge Instructions    Call MD for:  persistant nausea and vomiting    Complete by:  As directed      Call MD for:  redness, tenderness, or signs of infection (pain, swelling, redness, odor or green/yellow discharge around incision site)    Complete by:  As directed      Call MD for:  severe uncontrolled pain    Complete by:  As directed      Diet - low sodium heart healthy    Complete by:  As directed      Discharge instructions    Complete by:  As directed   Call [phone] with questions or problems     Driving Restrictions    Complete by:  As directed   While taking strong pain medicine     Increase activity slowly    Complete by:  As directed      Lifting restrictions    Complete by:  As directed   No greater than 10-20 lbs for 6 weeks     May shower / Bathe    Complete by:  As directed      May walk up steps    Complete by:  As directed      Sexual Activity Restrictions    Complete by:  As directed   Pelvic rest - no douching, tampons or sex for 6 weeks            Medication List    STOP taking these medications        norethindrone-ethinyl estradiol 1/35 tablet  Commonly known as:  ORTHO-NOVUM, NORTREL,CYCLAFEM      TAKE these medications        albuterol 108 (90 BASE) MCG/ACT inhaler  Commonly known as:  PROVENTIL HFA;VENTOLIN HFA  Inhale 2 puffs into the lungs every 6 (six) hours as needed for wheezing or shortness of breath.     beclomethasone 80 MCG/ACT inhaler  Commonly known as:  QVAR  Inhale 2 puffs into the lungs 2 (two) times daily.     ibuprofen 800 MG tablet  Commonly known as:  ADVIL,MOTRIN  Take 1 tablet (800 mg total) by mouth every 8 (eight) hours as needed (mild pain).     oxyCODONE-acetaminophen 5-325 MG per tablet  Commonly known as:  PERCOCET/ROXICET  Take 1-2 tablets by mouth every 6 (six) hours as needed for severe pain (moderate to  severe pain (when tolerating fluids)).     topiramate 50 MG tablet  Commonly known as:  TOPAMAX  Take 50 mg by mouth 2 (two) times daily.           Follow-up Information    Follow up with Bovard-Stuckert, Sharnese Heath, MD. Schedule an appointment as soon as possible for a visit in 2 weeks.   Specialty:  Obstetrics and Gynecology   Why:  for post-op check, 6 wks for full post-op check    Contact information:   [address] [place] [address] [phone]       Signed: Sherian Rein 11/01/2014, 7:42 AM

## 2015-05-25 ENCOUNTER — Ambulatory Visit (INDEPENDENT_AMBULATORY_CARE_PROVIDER_SITE_OTHER): Payer: Self-pay | Admitting: Neurology

## 2015-05-25 ENCOUNTER — Ambulatory Visit (INDEPENDENT_AMBULATORY_CARE_PROVIDER_SITE_OTHER): Payer: BLUE CROSS/BLUE SHIELD | Admitting: Neurology

## 2015-05-25 DIAGNOSIS — R200 Anesthesia of skin: Secondary | ICD-10-CM

## 2015-05-25 DIAGNOSIS — R208 Other disturbances of skin sensation: Secondary | ICD-10-CM

## 2015-05-25 DIAGNOSIS — Z0289 Encounter for other administrative examinations: Secondary | ICD-10-CM

## 2015-05-25 NOTE — Progress Notes (Signed)
  [facility]    Provider:  Dr Lucia Gaskins Referring Provider: Roma Kayser, PA-C Primary Care Physician:  Macky Lower  History:  Ebony Espinoza is a 39 y.o. female here as a referral from Dr. Vernice Jefferson for left arm numbness. She has left arm numbness and pain. Worse after a cortizone shot. There is tingling and burning all the way to her fingers. Tingling in all the fingers and digit one will freeze. The left hand gets cold. The right arm tingles too sometimes but not as much. She has sore neck pain.    Summary: Nerve Conduction studies were performed on the left upper extremity. ADM Ulnar, APB Median motor conductions were within normal limits with normal F wave latencies.  2nd-digit Median, 5th-digit Ulnar and 1st-webspace Radial sensory conductions were within normal limits.   EMG needle evaluation was performed on selected left upper extremity muscles: The left Deltoid, left Biceps, left Triceps, left Brachialis, left Brachioradialis, left Flexor Pollicis Longus, left Extensor Digitorum Communis, left Flexor Digitorum Profundus (ulnar), left Pronator Teres, left First Dorsal Interoseous, left Opponens Pollicis, left Supraspinatus, left Infraspinatus and left C7/C8 paraspinal muscles were normal.   Conclusion: This is a normal study. No electrophysiologic evidence for left median or ulnar neuropathy, left Brachial Plexopathy or left cervical radiculopathy.

## 2015-05-28 NOTE — Procedures (Signed)
[facility]    Provider:  Dr Lucia Gaskins Referring Provider: Roma Kayser, PA-C Primary Care Physician:  Macky Lower  History:  Ebony Espinoza is a 39 y.o. female here as a referral from Dr. Vernice Jefferson for left arm numbness. She has left arm numbness and pain. Worse after a cortizone shot. There is tingling and burning all the way to her fingers. Tingling in all the fingers and digit one will freeze. The left hand gets cold. The right arm tingles too sometimes but not as much. She has sore neck pain.    Summary: Nerve Conduction studies were performed on the bilateral upper extremities. ADM Ulnar, APB Median motor conductions were within normal limits with normal F wave latencies.  2nd-digit Median, 5th-digit Ulnar and 1st-webspace Radial sensory conductions were within normal limits.   EMG needle evaluation was performed on selected left upper extremity muscles: The left Deltoid, left Biceps, left Triceps, left Brachialis, left Brachioradialis, left Flexor Pollicis Longus, left Extensor Digitorum Communis, left Flexor Digitorum Profundus (ulnar), left Pronator Teres, left First Dorsal Interoseous, left Opponens Pollicis, left Supraspinatus, left Infraspinatus and left C7/C8 paraspinal muscles were normal.   Conclusion: This is a normal study. No electrophysiologic evidence for left median or ulnar neuropathy, left Brachial Plexopathy or left cervical radiculopathy.

## 2015-05-28 NOTE — Progress Notes (Signed)
See procedure notes.

## 2016-10-14 ENCOUNTER — Other Ambulatory Visit (HOSPITAL_COMMUNITY): Payer: Self-pay | Admitting: Pulmonary Disease

## 2016-10-14 ENCOUNTER — Ambulatory Visit (HOSPITAL_COMMUNITY)
Admission: RE | Admit: 2016-10-14 | Discharge: 2016-10-14 | Disposition: A | Discharge status: Home / Self Care | Payer: BLUE CROSS/BLUE SHIELD | Source: Ambulatory Visit | Attending: Pulmonary Disease | Admitting: Pulmonary Disease

## 2016-10-14 DIAGNOSIS — R0602 Shortness of breath: Secondary | ICD-10-CM | POA: Insufficient documentation

## 2017-01-28 ENCOUNTER — Other Ambulatory Visit (HOSPITAL_COMMUNITY): Payer: Self-pay | Admitting: Neurology

## 2017-01-28 DIAGNOSIS — G35 Multiple sclerosis: Secondary | ICD-10-CM

## 2017-01-28 DIAGNOSIS — R569 Unspecified convulsions: Secondary | ICD-10-CM

## 2017-02-06 ENCOUNTER — Other Ambulatory Visit (HOSPITAL_COMMUNITY): Payer: Self-pay | Admitting: Neurology

## 2017-02-06 DIAGNOSIS — G35 Multiple sclerosis: Secondary | ICD-10-CM

## 2017-02-06 DIAGNOSIS — G959 Disease of spinal cord, unspecified: Secondary | ICD-10-CM

## 2017-02-07 ENCOUNTER — Encounter (HOSPITAL_COMMUNITY): Payer: Self-pay

## 2017-02-07 ENCOUNTER — Ambulatory Visit (HOSPITAL_COMMUNITY): Payer: BLUE CROSS/BLUE SHIELD

## 2017-02-28 ENCOUNTER — Ambulatory Visit (HOSPITAL_COMMUNITY)
Admission: RE | Admit: 2017-02-28 | Discharge: 2017-02-28 | Disposition: A | Discharge status: Home / Self Care | Payer: BLUE CROSS/BLUE SHIELD | Source: Ambulatory Visit | Attending: Neurology | Admitting: Neurology

## 2017-02-28 DIAGNOSIS — M50023 Cervical disc disorder at C6-C7 level with myelopathy: Secondary | ICD-10-CM | POA: Diagnosis not present

## 2017-02-28 DIAGNOSIS — G35 Multiple sclerosis: Secondary | ICD-10-CM

## 2017-02-28 DIAGNOSIS — R569 Unspecified convulsions: Secondary | ICD-10-CM | POA: Diagnosis not present

## 2017-02-28 DIAGNOSIS — G959 Disease of spinal cord, unspecified: Secondary | ICD-10-CM

## 2017-02-28 MED ORDER — GADOBENATE DIMEGLUMINE 529 MG/ML IV SOLN
15.0000 mL | Freq: Once | INTRAVENOUS | Status: AC | PRN
  Administered 2017-02-28: 15 mL via INTRAVENOUS

## 2017-07-16 ENCOUNTER — Ambulatory Visit: Payer: BLUE CROSS/BLUE SHIELD | Admitting: Urology

## 2017-07-16 ENCOUNTER — Other Ambulatory Visit (HOSPITAL_COMMUNITY)
Admission: RE | Admit: 2017-07-16 | Discharge: 2017-07-16 | Disposition: A | Discharge status: Home / Self Care | Payer: BLUE CROSS/BLUE SHIELD | Source: Other Acute Inpatient Hospital | Attending: Urology | Admitting: Urology

## 2017-07-16 DIAGNOSIS — N3281 Overactive bladder: Secondary | ICD-10-CM | POA: Diagnosis not present

## 2017-07-16 DIAGNOSIS — R3129 Other microscopic hematuria: Secondary | ICD-10-CM | POA: Diagnosis not present

## 2017-07-16 DIAGNOSIS — R102 Pelvic and perineal pain: Secondary | ICD-10-CM | POA: Diagnosis not present

## 2017-07-16 DIAGNOSIS — R3121 Asymptomatic microscopic hematuria: Secondary | ICD-10-CM | POA: Diagnosis present

## 2017-07-16 LAB — URINALYSIS, COMPLETE (UACMP) WITH MICROSCOPIC
BILIRUBIN URINE: NEGATIVE
Bacteria, UA: NONE SEEN
Glucose, UA: NEGATIVE mg/dL
Ketones, ur: NEGATIVE mg/dL
LEUKOCYTES UA: NEGATIVE
NITRITE: NEGATIVE
Protein, ur: NEGATIVE mg/dL
SPECIFIC GRAVITY, URINE: 1.011 (ref 1.005–1.030)
pH: 5 (ref 5.0–8.0)

## 2017-08-20 ENCOUNTER — Ambulatory Visit: Payer: BLUE CROSS/BLUE SHIELD | Admitting: Urology

## 2017-09-24 ENCOUNTER — Ambulatory Visit: Payer: BLUE CROSS/BLUE SHIELD | Admitting: Urology

## 2018-02-25 ENCOUNTER — Ambulatory Visit: Payer: BLUE CROSS/BLUE SHIELD | Admitting: Urology

## 2018-04-15 ENCOUNTER — Ambulatory Visit: Payer: BLUE CROSS/BLUE SHIELD | Admitting: Urology

## 2018-04-15 DIAGNOSIS — N3281 Overactive bladder: Secondary | ICD-10-CM

## 2018-04-15 DIAGNOSIS — R102 Pelvic and perineal pain: Secondary | ICD-10-CM | POA: Diagnosis not present

## 2018-11-10 ENCOUNTER — Other Ambulatory Visit: Payer: Self-pay | Admitting: Otolaryngology

## 2018-11-13 ENCOUNTER — Other Ambulatory Visit: Payer: Self-pay | Admitting: Otolaryngology

## 2018-11-13 DIAGNOSIS — R1313 Dysphagia, pharyngeal phase: Secondary | ICD-10-CM

## 2018-12-07 ENCOUNTER — Other Ambulatory Visit: Payer: BLUE CROSS/BLUE SHIELD

## 2019-01-28 ENCOUNTER — Other Ambulatory Visit: Payer: BLUE CROSS/BLUE SHIELD

## 2019-04-30 ENCOUNTER — Other Ambulatory Visit: Payer: BLUE CROSS/BLUE SHIELD

## 2019-05-30 IMAGING — MR MR HEAD WO/W CM
9 of 15 series · 26 of 48 positions shown · IV contrast (multihance)
Comparison: None.

CLINICAL DATA: History of multiple sclerosis. Right-sided weakness
and numbness.

EXAM:
MRI HEAD WITHOUT AND WITH CONTRAST
TECHNIQUE: Multiplanar, multiecho pulse sequences of the brain and surrounding
structures were obtained without and with intravenous contrast.
CONTRAST:  15mL MULTIHANCE GADOBENATE DIMEGLUMINE 529 MG/ML IV SOLN

[Series 2: FLAIR · axial · 3.0mm · 0.34mm/px · z∈[-99,+39]mm · 4 of 47 slices shown]
[im 1/47]
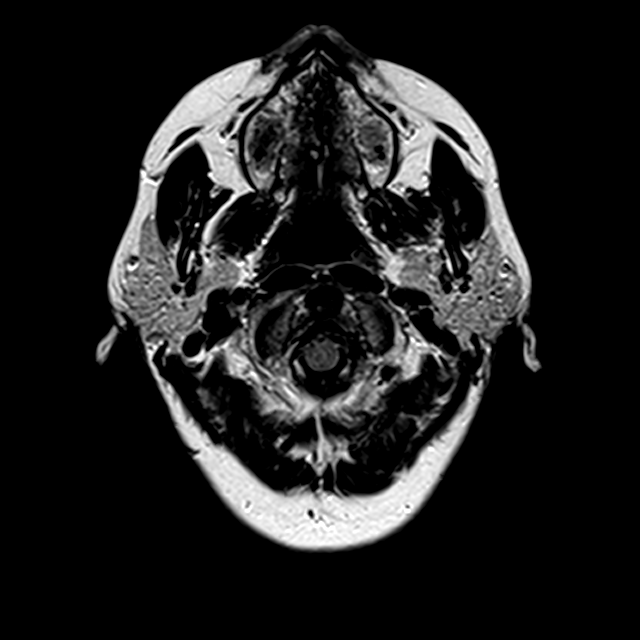
[im 16/47]
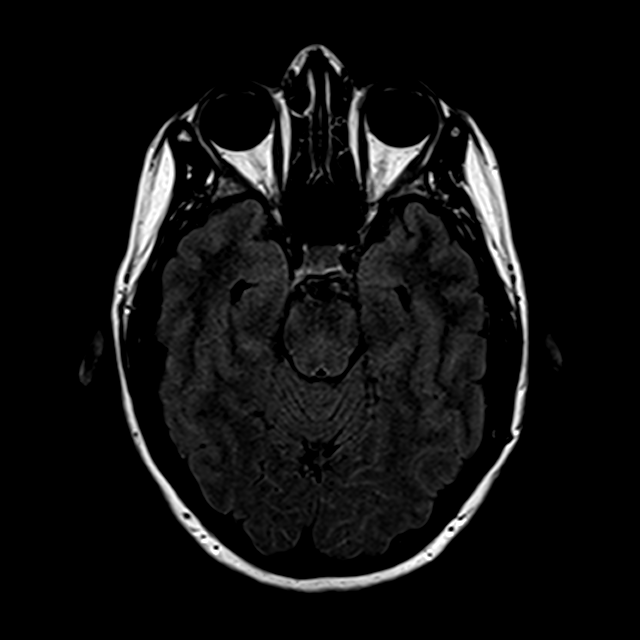
[im 31/47]
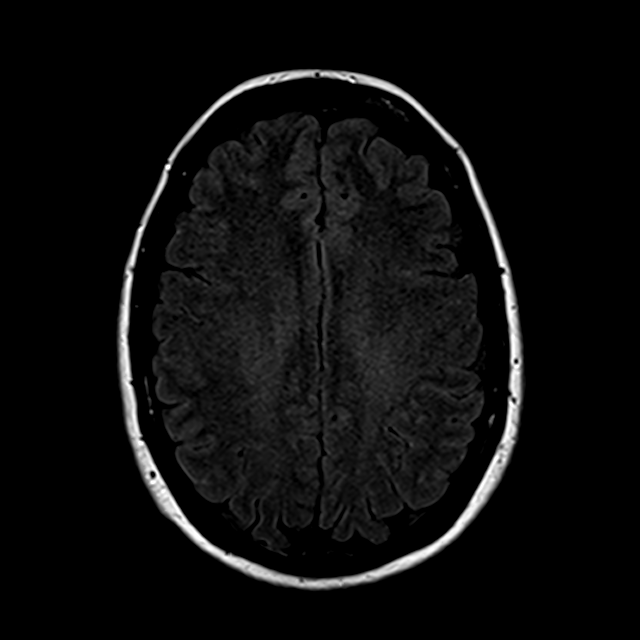
[im 47/47]
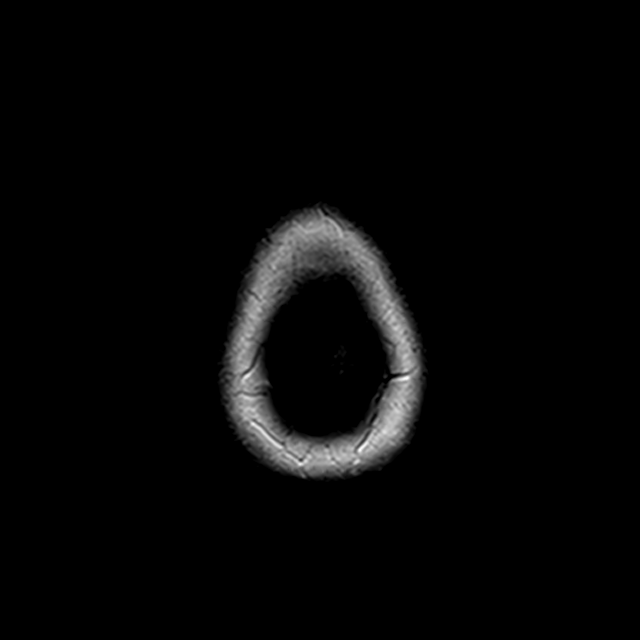

[Series 5: T2 · coronal · 5.0mm · 0.44mm/px · 1 of 26 slices shown]
[im 1/26]
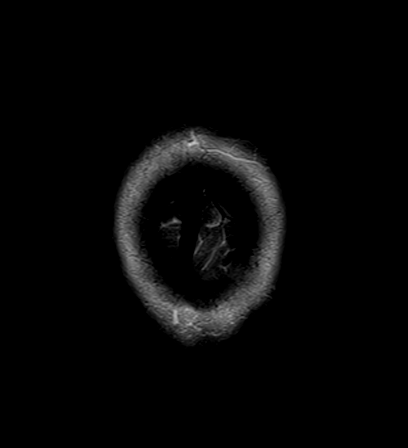

[Series 6: T1 post-contrast · axial · 2.0mm · 0.41mm/px · z∈[-115,+45]mm · 6 of 81 slices shown (1 of 3)]
[im 1/81]
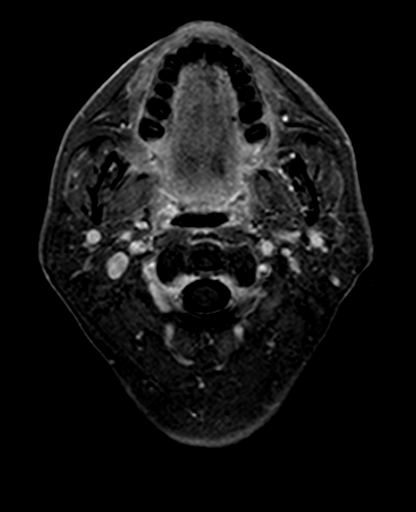
[im 17/81]
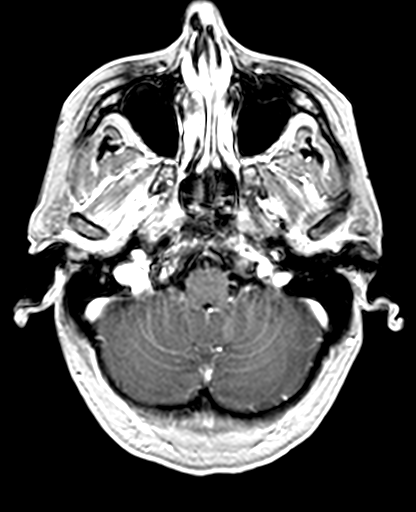
[im 33/81]
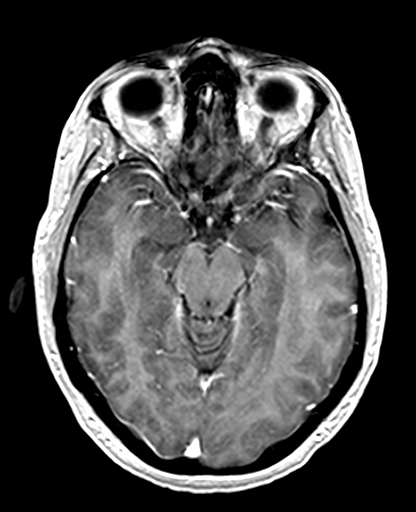
[im 49/81]
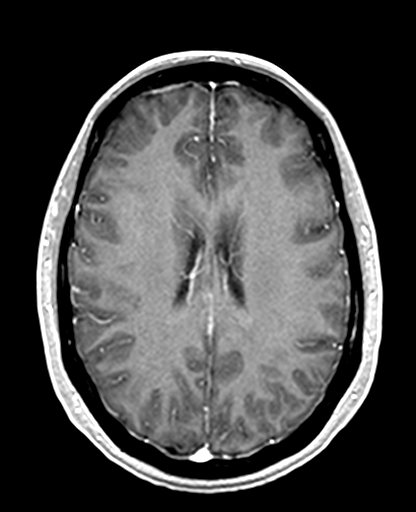
[im 65/81]
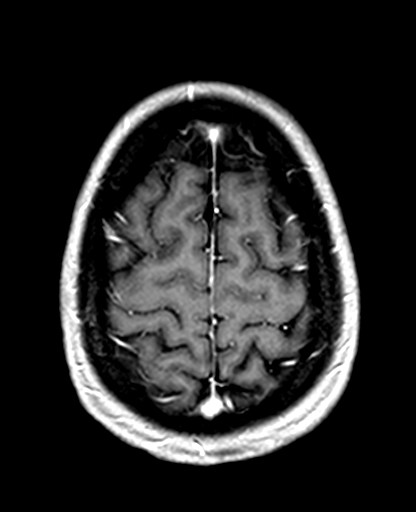
[im 81/81]
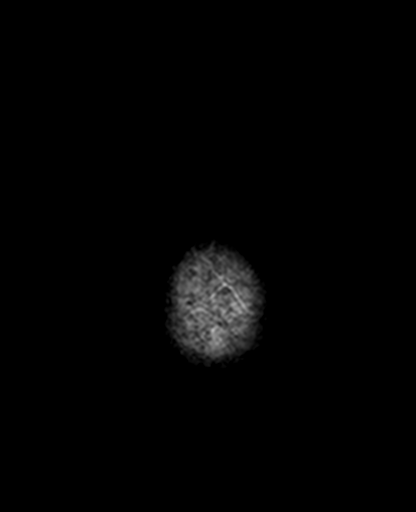

[Series 7: T1 post-contrast · coronal · 5.0mm · 0.37mm/px · 2 of 28 slices shown (2 of 3)]
[im 1/28]
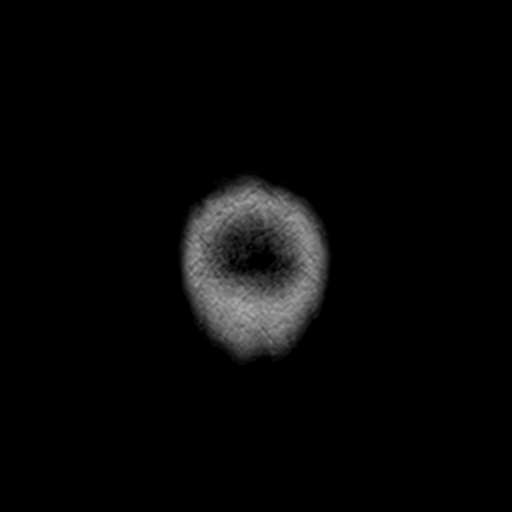
[im 28/28]
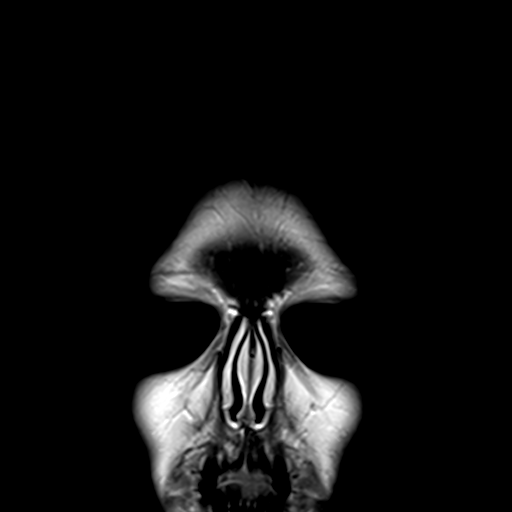

[Series 8: T1 post-contrast · sagittal · 5.0mm · 0.41mm/px · 1 of 21 slices shown (3 of 3)]
[im 1/21]
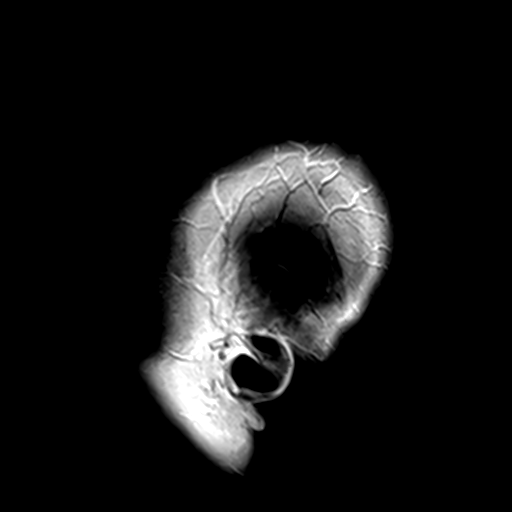

[Series 104: DWI · axial · 3.0mm · 0.68mm/px · z∈[-111,+50]mm · 4 of 55 slices shown (1 of 4)]
[im 1/55]
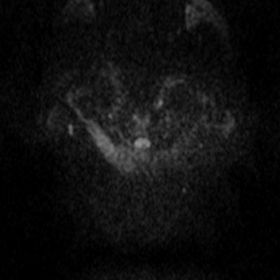
[im 19/55]
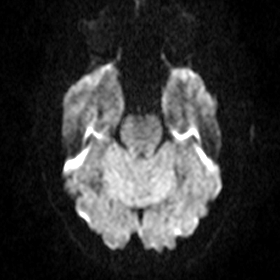
[im 37/55]
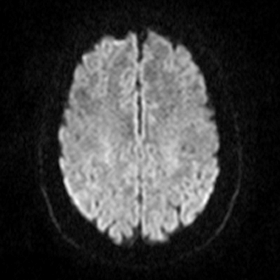
[im 55/55]
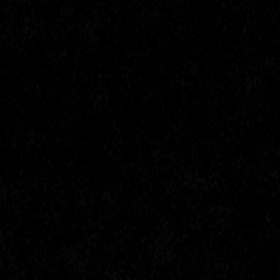

[Series 105: DWI · axial · 3.0mm · 0.74mm/px · z∈[-111,+48]mm · 4 of 54 slices shown (2 of 4)]
[im 1/54]
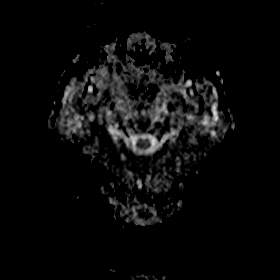
[im 18/54]
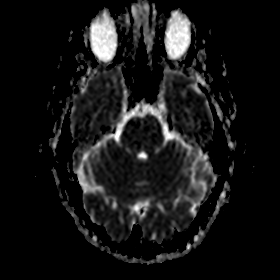
[im 36/54]
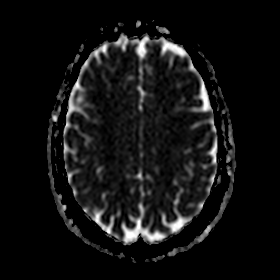
[im 54/54]
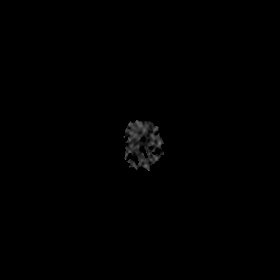

[Series 106: DWI · coronal · 5.0mm · 0.44mm/px · 2 of 34 slices shown (3 of 4)]
[im 1/34]
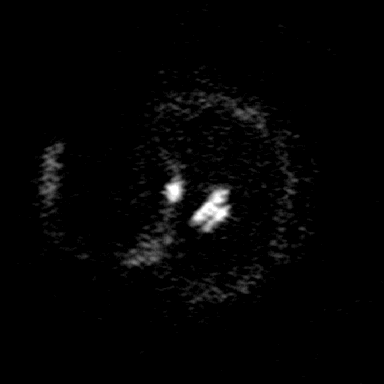
[im 34/34]
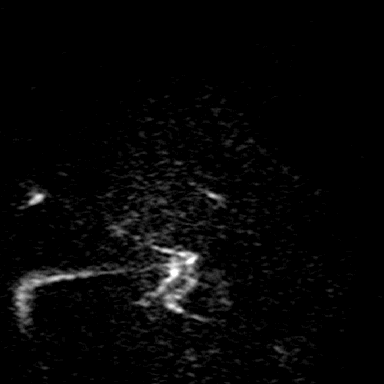

[Series 107: DWI · coronal · 5.0mm · 0.49mm/px · 2 of 33 slices shown (4 of 4)]
[im 1/33]
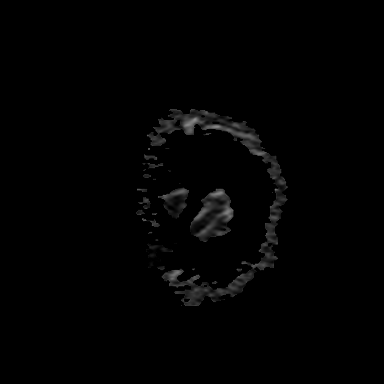
[im 33/33]
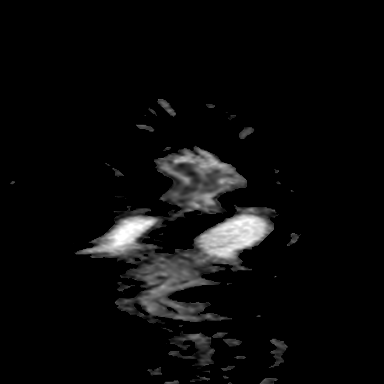

[26 of 48 positions shown; findings below may reference images not displayed]

FINDINGS: Brain: The midline structures are normal. There is no focal
diffusion restriction to indicate acute infarct. The brain
parenchymal signal is normal and there is no mass lesion. No
intraparenchymal hematoma or chronic microhemorrhage. Brain volume
is normal for age without age-advanced or lobar predominant atrophy.
The dura is normal and there is no extra-axial collection.

Vascular: Major intracranial arterial and venous sinus flow voids
are preserved.

Skull and upper cervical spine: The visualized skull base,
calvarium, upper cervical spine and extracranial soft tissues are
normal.

Sinuses/Orbits: No fluid levels or advanced mucosal thickening. No
mastoid effusion. Normal orbits.
IMPRESSION: Normal MRI of the brain.  No evidence of demyelinating disease.

## 2019-06-09 ENCOUNTER — Ambulatory Visit (INDEPENDENT_AMBULATORY_CARE_PROVIDER_SITE_OTHER): Payer: BC Managed Care – PPO | Admitting: Urology

## 2019-06-09 DIAGNOSIS — R102 Pelvic and perineal pain: Secondary | ICD-10-CM | POA: Diagnosis not present

## 2019-06-09 DIAGNOSIS — N3281 Overactive bladder: Secondary | ICD-10-CM

## 2021-12-24 ENCOUNTER — Ambulatory Visit: Payer: BC Managed Care – PPO | Admitting: Cardiology

## 2021-12-24 ENCOUNTER — Encounter: Payer: Self-pay | Admitting: *Deleted

## 2021-12-24 ENCOUNTER — Encounter: Payer: Self-pay | Admitting: Cardiology

## 2021-12-24 ENCOUNTER — Other Ambulatory Visit: Payer: Self-pay | Admitting: Cardiology

## 2021-12-24 ENCOUNTER — Ambulatory Visit (INDEPENDENT_AMBULATORY_CARE_PROVIDER_SITE_OTHER): Payer: BC Managed Care – PPO

## 2021-12-24 VITALS — BP 108/72 | HR 54 | Ht 65.0 in | Wt 156.4 lb

## 2021-12-24 DIAGNOSIS — R002 Palpitations: Secondary | ICD-10-CM | POA: Diagnosis not present

## 2021-12-24 NOTE — Addendum Note (Signed)
Addended by: Lesle Chris on: 12/24/2021 03:50 PM ? ? Modules accepted: Orders ? ?

## 2021-12-24 NOTE — Patient Instructions (Signed)
Medication Instructions:  °Continue all current medications. ° °Labwork: °none ° °Testing/Procedures: °Your physician has recommended that you wear a 14 day event monitor. Event monitors are medical devices that record the heart’s electrical activity. Doctors most often us these monitors to diagnose arrhythmias. Arrhythmias are problems with the speed or rhythm of the heartbeat. The monitor is a small, portable device. You can wear one while you do your normal daily activities. This is usually used to diagnose what is causing palpitations/syncope (passing out). °Office will contact with results via phone or letter.    ° °Follow-Up: °Pending test results  ° °Any Other Special Instructions Will Be Listed Below (If Applicable). ° ° °If you need a refill on your cardiac medications before your next appointment, please call your pharmacy. ° °

## 2021-12-24 NOTE — Progress Notes (Signed)
? ? ? ?Clinical Summary ?Ebony Espinoza is a 46 y.o.female seen today for follow up of the following medical problems ? ?1.Palpitations ?- ongoing for a few months ?- occurs once every week to 2 weeks ?- can occur at ret or with activity ?- racing or flip flopping of heart, symptoms last few seconds up to 1 minute. Get lightheaded, some nausea ?- can sometimes get lightheaded with just looking up.  ? ?- no caffeine, EtoH.  ? ? 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  ? ? ?3. Vertigo ?Past Medical History:  ?Diagnosis Date  ? Asthma   ? Larynx disorder 2007  ? 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 Pelvic pain in female 10/29/2014  ? S/P laparoscopic assisted vaginal hysterectomy (LAVH) 10/31/2014  ? Shortness of breath dyspnea   ? related to asthma  ? Uterus, adenomyosis 10/29/2014  ? Vaginal delivery 2003, 2005  ? ? ? ?Allergies  ?Allergen Reactions  ? Morphine And Related Itching  ? Sulfa Antibiotics Rash  ? ? ? ?Current Outpatient Medications  ?Medication Sig Dispense Refill  ? albuterol (PROVENTIL HFA;VENTOLIN HFA) 108 (90 BASE) MCG/ACT inhaler Inhale 2 puffs into the lungs every 6 (six) hours as needed for wheezing or shortness of breath.    ? beclomethasone (QVAR) 80 MCG/ACT inhaler Inhale 2 puffs into the lungs 2 (two) times daily.    ? ibuprofen (ADVIL,MOTRIN) 800 MG tablet Take 1 tablet (800 mg total) by mouth every 8 (eight) hours as needed (mild pain). 40 tablet 1  ? oxyCODONE-acetaminophen (PERCOCET/ROXICET) 5-325 MG per tablet Take 1-2 tablets by mouth every 6 (six) hours as needed for severe pain (moderate to severe pain (when tolerating fluids)). 30 tablet 0  ? topiramate (TOPAMAX) 50 MG tablet Take 50 mg by mouth 2 (two) times daily.    ? ?No current facility-administered medications for this visit.  ? ? ? ?Past Surgical History:  ?Procedure Laterality Date  ? BILATERAL SALPINGECTOMY Bilateral 10/31/2014  ? Procedure:  BILATERAL SALPINGECTOMY;  Surgeon: Janyth Contes, MD;  Location: [facility];  Service: Gynecology;  Laterality: Bilateral;  vaginal  ? CHOLECYSTECTOMY    ? LAPAROSCOPIC ASSISTED VAGINAL HYSTERECTOMY N/A 10/31/2014  ? Procedure: LAPAROSCOPIC ASSISTED VAGINAL HYSTERECTOMY;  Surgeon: Janyth Contes, MD;  Location: [facility];  Service: Gynecology;  Laterality: N/A;  vaginal   ? MICROLARYNGOSCOPY WITH CO2 LASER AND EXCISION OF VOCAL CORD LESION    ? ? ? ?Allergies  ?Allergen Reactions  ? Morphine And Related Itching  ? Sulfa Antibiotics Rash  ? ? ? ? ?No family history on file. ? ? ?Social History ?Ms. Dawe reports that she has never smoked. She does not have any smokeless tobacco history on file. ?Ms. Fluegge reports no history of alcohol use. ? ? ?Review of Systems ?CONSTITUTIONAL: No weight loss, fever, chills, weakness or fatigue.  ?HEENT: Eyes: No visual loss, blurred vision, double vision or yellow sclerae.No hearing loss, sneezing, congestion, runny nose or sore throat.  ?SKIN: No rash or itching.  ?CARDIOVASCULAR: per hpi ?RESPIRATORY: No shortness of breath, cough or sputum.  ?GASTROINTESTINAL: No anorexia, nausea, vomiting or diarrhea. No abdominal pain or blood.  ?GENITOURINARY: No burning on urination, no polyuria ?NEUROLOGICAL: No headache, dizziness, syncope, paralysis, ataxia, numbness or tingling in the extremities. No change in bowel or bladder control.  ?MUSCULOSKELETAL: No muscle, back pain, joint pain or stiffness.  ?LYMPHATICS: No enlarged nodes. No history  of splenectomy.  ?PSYCHIATRIC: No history of depression or anxiety.  ?ENDOCRINOLOGIC: No reports of sweating, cold or heat intolerance. No polyuria or polydipsia.  ?. ? ? ?Physical Examination ?Today's Vitals  ? 12/24/21 1505  ?BP: 108/72  ?Pulse: (!) 54  ?SpO2: 96%  ?Weight: 156 lb 6.4 oz (70.9 kg)  ?Height: _0  (1.651 m)  ? ?Body mass index is 26.03 kg/m?. ? ?Gen: resting comfortably, no acute distress ?HEENT: no scleral icterus,  pupils equal round and reactive, no palptable cervical adenopathy,  ?CV: RRR, no m/r/g no jvd ?Resp: Clear to auscultation bilaterally ?GI: abdomen is soft, non-tender, non-distended, normal bowel sounds, no hepatosplenomegaly ?MSK: extremities are warm, no edema.  ?Skin: warm, no rash ?Neuro:  no focal deficits ?Psych: appropriate affect ? ? ?Assessment and Plan  ?1.Palpitations ?- EKG today shows SR 67 ?- plan for 14 day zio patch to further evaluate palpitations.  ?- some of her dizzienss sounds related to vertigo, triggred with looking up. Defer to pcp, this would not be related to any cardiac issue.  ? ? ? ? ? ?Arnoldo Lenis, M.D ?

## 2021-12-25 NOTE — Telephone Encounter (Signed)
I did not notice any snappiness at all. Dizziness can be complex as there can be many different causes, and sometimes patients can have more than one type of dizziness that each have a separate cause so trying to tease out the differences can be challenging. Look forward to working with her to see if any cardiac issues are contributing ? ? ?Dominga Ferry MD ?

## 2021-12-27 DIAGNOSIS — R002 Palpitations: Secondary | ICD-10-CM | POA: Diagnosis not present

## 2022-02-01 ENCOUNTER — Telehealth: Payer: Self-pay

## 2022-02-01 NOTE — Telephone Encounter (Signed)
Patient notified and verbalized understanding. Patient had no questions or concerns at this time.  

## 2022-02-01 NOTE — Telephone Encounter (Signed)
-----   Message from Antoine Poche, MD sent at 01/30/2022  3:19 PM EDT ----- Heart monitor with just rare extra heart beats which is considered benign. Not something that has to be treated unless palpitatoins are very bothersome. The medicine we use slows the heart rate down some, her heart rate runs on the low side and the worry would be this could lower the heart rate too much and lead to symptoms of low heart rates. I think if symptoms are tolerable would just monitor at this time, strictly limit caffeine  Dominga Ferry MD

## 2022-03-04 ENCOUNTER — Encounter: Payer: Self-pay | Admitting: Cardiology

## 2022-03-06 ENCOUNTER — Ambulatory Visit (HOSPITAL_COMMUNITY): Payer: BC Managed Care – PPO | Attending: Neurology | Admitting: Physical Therapy

## 2022-03-06 ENCOUNTER — Encounter (HOSPITAL_COMMUNITY): Payer: Self-pay | Admitting: Physical Therapy

## 2022-03-06 DIAGNOSIS — R262 Difficulty in walking, not elsewhere classified: Secondary | ICD-10-CM | POA: Insufficient documentation

## 2022-03-06 DIAGNOSIS — R2689 Other abnormalities of gait and mobility: Secondary | ICD-10-CM | POA: Diagnosis present

## 2022-03-06 NOTE — Therapy (Signed)
OUTPATIENT PHYSICAL THERAPY LOWER EXTREMITY EVALUATION   Patient Name: Ebony Espinoza MRN: [id number] DOB:1976-05-30, 46 y.o., female Today's Date: 03/06/2022   PT End of Session - 03/06/22 1341     Visit Number 1    Number of Visits 12    Date for PT Re-Evaluation 04/17/22    Authorization Type [employer] COMM PPO (check auth)    Progress Note Due on Visit 10    PT Start Time 1303    PT Stop Time 1343    PT Time Calculation (min) 40 min    Activity Tolerance Patient tolerated treatment well    Behavior During Therapy [facility] for tasks assessed/performed             Past Medical History:  Diagnosis Date   Asthma    Larynx disorder 2007   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   Pelvic pain in female 10/29/2014   S/P laparoscopic assisted vaginal hysterectomy (LAVH) 10/31/2014   Shortness of breath dyspnea    related to asthma   Uterus, adenomyosis 10/29/2014   Vaginal delivery 2003, 2005   Past Surgical History:  Procedure Laterality Date   BILATERAL SALPINGECTOMY Bilateral 10/31/2014   Procedure: BILATERAL SALPINGECTOMY;  Surgeon: Sherian Rein, MD;  Location: [facility];  Service: Gynecology;  Laterality: Bilateral;  vaginal   CHOLECYSTECTOMY     LAPAROSCOPIC ASSISTED VAGINAL HYSTERECTOMY N/A 10/31/2014   Procedure: LAPAROSCOPIC ASSISTED VAGINAL HYSTERECTOMY;  Surgeon: Sherian Rein, MD;  Location: [facility];  Service: Gynecology;  Laterality: N/A;  vaginal    MICROLARYNGOSCOPY WITH CO2 LASER AND EXCISION OF VOCAL CORD LESION     Patient Active Problem List   Diagnosis Date Noted   S/P laparoscopic assisted vaginal hysterectomy (LAVH) 10/31/2014   Uterus, adenomyosis 10/29/2014   Pelvic pain in female 10/29/2014   LIVER FUNCTION TESTS, ABNORMAL 01/23/2008   CHOLELITHIASIS, HX OF 01/23/2008    PCP: Wayland Denis Pa-C   REFERRING PROVIDER: Beryle Beams, MD   REFERRING DIAG: PT eval/tx for gait training   THERAPY DIAG:  Other abnormalities of  gait and mobility  Difficulty in walking, not elsewhere classified  Rationale for Evaluation and Treatment Rehabilitation  ONSET DATE: Chronic   SUBJECTIVE:   SUBJECTIVE STATEMENT: Patient presents with complaint of gait and balance disturbance. She is unsure why, and has no known diagnosis per subjective report. She has undergone some testing with no conclusive answers. She states her RT knee does not bend (no known MOI) and she does los her balance often. She uses cane PRN for walking. She has had therapy for this previously with some benefit. She states balance has gotten progressively worse.   PERTINENT HISTORY: Arthritis, scoliosis   PAIN:  Are you having pain? Yes: NPRS scale: 8/10 Pain location: low back  Pain description: aching  Aggravating factors: walking  Relieving factors: rest   PRECAUTIONS: None  WEIGHT BEARING RESTRICTIONS No  FALLS:  Has patient fallen in last 6 months? Yes. Number of falls 4-5 "trips"   LIVING ENVIRONMENT: Lives with: lives with their family and lives with their spouse Lives in: House/apartment Stairs: Yes: External: 2 steps; none Has following equipment at home: Single point cane  OCCUPATION: Disabled   PLOF: Independent with basic ADLs  PATIENT GOALS "not to hurt, improve ability to get out of chair"   OBJECTIVE:   DIAGNOSTIC FINDINGS: IMPRESSION: Normal MRI of the brain.  No evidence of demyelinating disease.  IMPRESSION: MRI of the cervical spine is negative for MS or  intraspinal lesions.   Central protrusion at C6-7. Lower cervical facet arthropathy. No impingement at any level.   COGNITION:  Overall cognitive status: Within functional limits for tasks assessed    LE AROM Knee flexion/ extension RT: -30 to 60 deg LT 0-130 deg  LOWER EXTREMITY MMT:  MMT Right eval Left eval  Hip flexion 3- 3-  Hip extension 2+ 3-  Hip abduction 3- 3+  Hip adduction    Hip internal rotation    Hip external rotation    Knee  flexion    Knee extension 3- 3  Ankle dorsiflexion 3- 3  Ankle plantarflexion    Ankle inversion    Ankle eversion     (Blank rows = not tested)   FUNCTIONAL TESTS:  5 times sit to stand: Completed 1 rep in 30 seconds with hands on thighs  2 minute walk test: 110 feet with straight cane   GAIT: Distance walked: 110 feet  Assistive device utilized: Single point cane Level of assistance: SBA Comments: decreased stride, RT knee flexed, RT ankle PF ( )    TODAY'S TREATMENT: Eval:  Seated calf stretch Laq (available ROM) March (available ROM)  Seated heel slide   PATIENT EDUCATION:  Education details: on eval findings, POC and HEP  Person educated: Patient Education method: Explanation Education comprehension: verbalized understanding   HOME EXERCISE PROGRAM: Access Code: [email or web address] URL: [email or web address] Date: 03/06/2022 Prepared by: Georges Lynch  Exercises - Seated Knee Flexion AAROM  - 2-3 x daily - 7 x weekly - 1 sets - 10 reps - 5 second hold - Seated Calf Stretch with Strap  - 2-3 x daily - 7 x weekly - 1 sets - 4 reps - 20 second hold - Seated Long Arc Quad  - 2-3 x daily - 7 x weekly - 1 sets - 10 reps - Seated March  - 2-3 x daily - 7 x weekly - 1 sets - 10 reps  ASSESSMENT:  CLINICAL IMPRESSION: Patient is a 46 y.o. female who presents to physical therapy with complaint of balance and gait difficulty. Patient demonstrates decreased strength, balance deficits and gait abnormalities which are negatively impacting patient ability to perform ADLs and functional mobility tasks. Patient will benefit from skilled physical therapy services to address these deficits to improve level of function with ADLs, functional mobility tasks, and reduce risk for falls.     OBJECTIVE IMPAIRMENTS Abnormal gait, decreased activity tolerance, decreased balance, decreased mobility, difficulty walking, decreased ROM, decreased strength, hypomobility, impaired  perceived functional ability, impaired flexibility, improper body mechanics, and pain.   ACTIVITY LIMITATIONS standing, stairs, transfers, bed mobility, and locomotion level  PARTICIPATION LIMITATIONS: meal prep, cleaning, laundry, shopping, community activity, and yard work  PERSONAL FACTORS Past/current experiences and Time since onset of injury/illness/exacerbation are also affecting patient's functional outcome.   REHAB POTENTIAL: Good  CLINICAL DECISION MAKING: Stable/uncomplicated  EVALUATION COMPLEXITY: Low   GOALS: SHORT TERM GOALS: Target date: 03/27/2022  Patient will be independent with initial HEP and self-management strategies to improve functional outcomes Baseline:  Goal status: INITIAL    LONG TERM GOALS: Target date: 04/17/2022  Patient will be independent with advanced HEP and self-management strategies to improve functional outcomes Baseline:  Goal status: INITIAL  2.  Patient will report at least 50% overall improvement in subjective complaint to indicate improvement in ability to perform ADLs. Baseline:  Goal status: INITIAL  3.  Patient will have RT knee AROM 15-100 degrees to improve functional mobility and facilitate squatting to pick  up items from floor. Baseline: See ROM Goal status: INITIAL  4. Patient will have equal to or > 4/5 MMT throughout BLE to improve ability to perform functional mobility, stair ambulation and ADLs.  Baseline: See MMT Goal status: INITIAL  5. Patient will be able to ambulate at least 250 feet during with LRAD to demonstrate improved ability to perform functional mobility and associated tasks. Baseline: 110 feet with cane  Goal status: INITIAL   PLAN: PT FREQUENCY: 2x/week  PT DURATION: 6 weeks  PLANNED INTERVENTIONS: Therapeutic exercises, Therapeutic activity, Neuromuscular re-education, Balance training, Gait training, Patient/Family education, Joint manipulation, Joint mobilization, Stair training, Aquatic  Therapy, Dry Needling, Electrical stimulation, Spinal manipulation, Spinal mobilization, Cryotherapy, Moist heat, scar mobilization, Taping, Traction, Ultrasound, Biofeedback, Ionotophoresis 4mg /ml Dexamethasone, and Manual therapy.   PLAN FOR NEXT SESSION: LE strength, RT knee and ankle ROM. Balance and gait progression as tolerated.    , PT 03/06/2022, 1:42 PM

## 2022-03-07 ENCOUNTER — Encounter (HOSPITAL_COMMUNITY): Payer: Self-pay | Admitting: Physical Therapy

## 2022-03-13 ENCOUNTER — Encounter (HOSPITAL_COMMUNITY): Payer: BC Managed Care – PPO

## 2022-03-19 ENCOUNTER — Encounter (HOSPITAL_COMMUNITY): Payer: BC Managed Care – PPO

## 2022-03-26 ENCOUNTER — Encounter (HOSPITAL_COMMUNITY): Payer: BC Managed Care – PPO

## 2022-03-28 ENCOUNTER — Encounter (HOSPITAL_COMMUNITY): Payer: BC Managed Care – PPO

## 2022-04-02 ENCOUNTER — Encounter (HOSPITAL_COMMUNITY): Payer: BC Managed Care – PPO

## 2022-04-04 ENCOUNTER — Encounter (HOSPITAL_COMMUNITY): Payer: BC Managed Care – PPO

## 2022-04-10 ENCOUNTER — Encounter (HOSPITAL_COMMUNITY): Payer: BC Managed Care – PPO | Admitting: Physical Therapy

## 2022-04-12 ENCOUNTER — Encounter (HOSPITAL_COMMUNITY): Payer: BC Managed Care – PPO

## 2022-04-16 ENCOUNTER — Encounter (HOSPITAL_COMMUNITY): Payer: BC Managed Care – PPO | Admitting: Physical Therapy

## 2022-04-16 ENCOUNTER — Telehealth (HOSPITAL_COMMUNITY): Payer: Self-pay | Admitting: Physical Therapy

## 2022-04-16 NOTE — Telephone Encounter (Signed)
S/w Patient she request to be D/c due to can't afford to keep coming her Copay is to high.

## 2022-04-18 ENCOUNTER — Encounter (HOSPITAL_COMMUNITY): Payer: BC Managed Care – PPO

## 2022-04-22 ENCOUNTER — Encounter (HOSPITAL_COMMUNITY): Payer: BC Managed Care – PPO

## 2022-04-24 ENCOUNTER — Encounter (HOSPITAL_COMMUNITY): Payer: BC Managed Care – PPO | Admitting: Physical Therapy

## 2022-04-30 ENCOUNTER — Encounter (HOSPITAL_COMMUNITY): Payer: BC Managed Care – PPO | Admitting: Physical Therapy

## 2022-05-02 ENCOUNTER — Encounter (HOSPITAL_COMMUNITY): Payer: BC Managed Care – PPO

## 2022-05-07 ENCOUNTER — Encounter (HOSPITAL_COMMUNITY): Payer: BC Managed Care – PPO | Admitting: Physical Therapy

## 2022-05-09 ENCOUNTER — Encounter (HOSPITAL_COMMUNITY): Payer: BC Managed Care – PPO

## 2022-05-14 ENCOUNTER — Encounter (HOSPITAL_COMMUNITY): Payer: BC Managed Care – PPO

## 2022-05-16 ENCOUNTER — Encounter (HOSPITAL_COMMUNITY): Payer: BC Managed Care – PPO | Admitting: Physical Therapy

## 2022-05-21 ENCOUNTER — Encounter (HOSPITAL_COMMUNITY): Payer: BC Managed Care – PPO

## 2022-05-23 ENCOUNTER — Encounter (HOSPITAL_COMMUNITY): Payer: BC Managed Care – PPO

## 2022-05-28 ENCOUNTER — Encounter (HOSPITAL_COMMUNITY): Payer: BC Managed Care – PPO

## 2022-05-30 ENCOUNTER — Encounter (HOSPITAL_COMMUNITY): Payer: BC Managed Care – PPO | Admitting: Physical Therapy

## 2022-06-26 ENCOUNTER — Encounter: Payer: Self-pay | Admitting: Neurology

## 2022-06-26 ENCOUNTER — Ambulatory Visit: Payer: BC Managed Care – PPO | Admitting: Neurology

## 2022-06-26 VITALS — BP 125/79 | HR 63 | Ht 65.0 in | Wt 154.0 lb

## 2022-06-26 DIAGNOSIS — H5460 Unqualified visual loss, one eye, unspecified: Secondary | ICD-10-CM

## 2022-06-26 DIAGNOSIS — R251 Tremor, unspecified: Secondary | ICD-10-CM | POA: Diagnosis not present

## 2022-06-26 DIAGNOSIS — M6289 Other specified disorders of muscle: Secondary | ICD-10-CM

## 2022-06-26 DIAGNOSIS — G8191 Hemiplegia, unspecified affecting right dominant side: Secondary | ICD-10-CM

## 2022-06-26 DIAGNOSIS — R200 Anesthesia of skin: Secondary | ICD-10-CM

## 2022-06-26 DIAGNOSIS — R498 Other voice and resonance disorders: Secondary | ICD-10-CM

## 2022-06-26 DIAGNOSIS — R292 Abnormal reflex: Secondary | ICD-10-CM

## 2022-06-26 NOTE — Progress Notes (Unsigned)
[facility]    Provider:  Dr Jaynee Eagles Requesting Provider: Rosalee Kaufman, * Primary Care Provider:  Rosalee Kaufman, PA-C  CC:  Migraines, hx of seizure-like events and tremor, RA  HPI:  Ebony Espinoza is a 46 y.o. female here as requested by Rosalee Kaufman, * for headaches/migraines and stable seizures. PMHx headaches and seizures.  I reviewed Dr. Freddie Apley notes, patient has been seen chronically for headaches and seizures, her seizures have been well controlled, she reports about having 4 headache days in a month, she was worked up approximately a year prior for progressive right-sided weakness, the right-sided weakness has been present for over a year, extensive work-up at that time with cervical MRI, MRI of the brain were both essentially unrevealing, she has seen her orthopedist who thinks this is a musculoskeletal issue with orthopedic problems, she reports however that she continues to have weakness on the right side, no seizures since her last appointment, doing well with chronic headaches and chronic pain, no recurrent "seizure spells".  I have seen patient in the past, for left arm numbness, in 2016 EMG nerve conduction study showed no electrophysiologic evidence for left median or ulnar neuropathy, left brachial plexopathy or left cervical radiculopathy essentially normal study, I reviewed Dr. Freddie Apley examinations which were normal including physical and neurologic, she appears to be on Vimpat 150 mg twice daily, gabapentin, for her rheumatoid arthritis she has Morrie Sheldon, also meloxicam and topiramate.  I also reviewed MRI of the cervical spine report from June 2018 which showed that it was negative for MS or intraspinal lesions, central protrusion at C6-C7 but no impingement at any level.  MRI of the brain showed normal MRI of the brain and MRI of the L-spine in April 2019 showed an L3-L4 disc protrusion affecting L4.  Diagnosed with hemiplegia,  long-term current use of drug therapy, epilepsy complex partial seizures, chronic migraine, seropositive rheumatoid arthritis.  Migraines are under control with emgality and topiramate. Her b12 is low and so is her vitamin D. I would suggest B12 1000 OTC daily and vitamin D OTC [phone] daily. She speaks quietly. Today her tremor is not bad, it is in the right arm, she says it gets worse and then gets better, her cheek and eyelid also tremors and her vision was blurry, she has an appointment with an eye doctor, steroids have helped the cheek and the eyelid and it has calmed down the tremor on the right. I advised her to take a video of the tremor when it is really bad. Having right eye vision changes, right-sided weakness, tremor of the right arm, numbness and tingling right hand, progressive getting worse, been ongoing for years and no one can figure it out but progressive, she has numbness right side of the face, she has hemiparesis, vision changes, sensory changes and increased tone right leg uses a cane. No other focal neurologic deficits, associated symptoms, inciting events or modifiable factors.  Reviewed notes, labs and imaging from outside physicians, which showed:  Blood work dated February 25, 2022 shows essentially normal c-Met with BUN 15 and creatinine 0.69, normal CBC, TSH 1.3 normal, B12 240 (this may be deficient) and folic acid 16.1, vitamin D 28.9,  From a thorough review of records, medications tried that can be used in migraine management include: Topamax, amantadine, Tylenol, Decadron injections, Benadryl, Emgality, Aimovig, ibuprofen, ondansetron, Paxil, amitriptyline, meloxicam,emgality, BP meds contraindicated due to hypotension  Review of Systems: Patient complains of symptoms per HPI as well as  the following symptoms right-sided numbness and weakness. Pertinent negatives and positives per HPI. All others negative.   Social History   Socioeconomic History   Marital status:  Married    Spouse name: Not on file   Number of children: 2   Years of education: Not on file   Highest education level: Not on file  Occupational History   Not on file  Tobacco Use   Smoking status: Never   Smokeless tobacco: Never  Vaping Use   Vaping Use: Never used  Substance and Sexual Activity   Alcohol use: No   Drug use: Never   Sexual activity: Not on file  Other Topics Concern   Not on file  Social History Narrative   Lives at home with husband and 2 daughters   Right handed   Caffeine: none    Social Determinants of Health   Financial Resource Strain: Not on file  Food Insecurity: Not on file  Transportation Needs: Not on file  Physical Activity: Not on file  Stress: Not on file  Social Connections: Not on file  Intimate Partner Violence: Not on file    Family History  Problem Relation Age of Onset   Prostate cancer Father    Migraines Paternal Grandmother    Myasthenia gravis Paternal Grandfather    Brain cancer Paternal Grandfather    High blood pressure Other    Parkinson's disease Neg Hx     Past Medical History:  Diagnosis Date   Arthritis    Asthma    DDD (degenerative disc disease), lumbar    Larynx disorder 09/09/2005   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   Pelvic pain in female 10/29/2014   S/P laparoscopic assisted vaginal hysterectomy (LAVH) 10/31/2014   Scoliosis    Shortness of breath dyspnea    related to asthma   Uterus, adenomyosis 10/29/2014   Vaginal delivery 2003, 2005    Patient Active Problem List   Diagnosis Date Noted   Hemiplegia affecting right dominant side ([place]) 06/27/2022   Tremor 06/27/2022   S/P laparoscopic assisted vaginal hysterectomy (LAVH) 10/31/2014   Uterus, adenomyosis 10/29/2014   Pelvic pain in female 10/29/2014   LIVER FUNCTION TESTS, ABNORMAL 01/23/2008   CHOLELITHIASIS, HX OF 01/23/2008    Past Surgical History:  Procedure Laterality Date   BILATERAL SALPINGECTOMY Bilateral  10/31/2014   Procedure: BILATERAL SALPINGECTOMY;  Surgeon: Janyth Contes, MD;  Location: [facility];  Service: Gynecology;  Laterality: Bilateral;  vaginal   CHOLECYSTECTOMY     LAPAROSCOPIC ASSISTED VAGINAL HYSTERECTOMY N/A 10/31/2014   Procedure: LAPAROSCOPIC ASSISTED VAGINAL HYSTERECTOMY;  Surgeon: Janyth Contes, MD;  Location: [facility];  Service: Gynecology;  Laterality: N/A;  vaginal    MICROLARYNGOSCOPY WITH CO2 LASER AND EXCISION OF VOCAL CORD LESION      Current Outpatient Medications  Medication Sig Dispense Refill   albuterol (PROVENTIL HFA;VENTOLIN HFA) 108 (90 BASE) MCG/ACT inhaler Inhale 2 puffs into the lungs every 6 (six) hours as needed for wheezing or shortness of breath.     amantadine (SYMMETREL) 100 MG capsule Take 1 capsule by mouth daily.     beclomethasone (QVAR) 80 MCG/ACT inhaler Inhale 2 puffs into the lungs 2 (two) times daily.     clobetasol cream (TEMOVATE) 8.24 % APPLY 1 APPLICATION TO AFFECTED AREA TWICE A DAY     diclofenac Sodium (VOLTAREN) 1 % GEL See admin instructions.     estradiol (CLIMARA - DOSED IN MG/24 HR) 0.1 mg/24hr patch Climara 0.1  mg/24 hr transdermal patch  Once patch a week     Galcanezumab-gnlm (EMGALITY) 120 MG/ML SOAJ every 30 (thirty) days.     ibuprofen (ADVIL,MOTRIN) 800 MG tablet Take 1 tablet (800 mg total) by mouth every 8 (eight) hours as needed (mild pain). 40 tablet 1   Ixekizumab (TALTZ) 80 MG/ML SOAJ INJECT 80 MG UNDER THE SKIN EVERY 4 WEEKS AS DIRECTED     meloxicam (MOBIC) 7.5 MG tablet Take 1 tablet by mouth 2 (two) times daily.     PARoxetine (PAXIL) 20 MG tablet Take 20 mg by mouth daily.     topiramate (TOPAMAX) 50 MG tablet Take 50 mg by mouth 2 (two) times daily.     valACYclovir (VALTREX) 500 MG tablet Take by mouth as needed (fever blisters).     No current facility-administered medications for this visit.    Allergies as of 06/26/2022 - Review Complete 06/26/2022  Allergen Reaction Noted   Morphine and  related Itching 10/25/2014   Oxycodone Itching and Rash 11/29/2017   Sulfa antibiotics Rash 10/31/2014    Vitals: BP 125/79 (BP Location: Left Arm, Patient Position: Sitting)   Pulse 63   Ht '5\' 5"'  (1.651 m)   Wt 154 lb (69.9 kg)   BMI 25.63 kg/m  Last Weight:  Wt Readings from Last 1 Encounters:  06/26/22 154 lb (69.9 kg)   Last Height:   Ht Readings from Last 1 Encounters:  06/26/22 '5\' 5"'  (1.651 m)     Physical exam: Exam: Gen: NAD, conversant, well nourised, well groomed                     CV: RRR, no MRG. No Carotid Bruits. No peripheral edema, warm, nontender Eyes: Conjunctivae clear without exudates or hemorrhage  Neuro: Detailed Neurologic Exam  Speech:    Speech is normal; fluent and spontaneous with normal comprehension.  Cognition:    The patient is oriented to person, place, and time;     recent and remote memory intact;     language fluent;     normal attention, concentration,     fund of knowledge Cranial Nerves:    The pupils are equal, round, and reactive to light. Difficulty examining fundi due to photophobia but appear normal. Visual fields are full to finger confrontation. Extraocular movements are intact. splits midlins to vibration and pin prick with decrease on the right face The face is symmetric. The palate elevates in the midline. Hearing intact. Voice is hypophonic. Shoulder shrug is normal. The tongue has normal motion without fasciculations.   Coordination:    No dysmetria  Gait:  Cane, antalgic, walks on right toe  Motor Observation:    No asymmetry, no atrophy, right tremor, distractable and disappears with other movements like touching her face or reaching for objects Tone:    Increased tone right leg  Posture:    Posture is normal. normal erect    Strength:    Difficult exam, poor effort, giveway, question functional quality, right > left generalized weakness.      Sensation: intact to LT, +romberg, splits midline to vibration  and pin prick with decrease on the right face, arm,leg and trunk     Reflex Exam:  DTR's:    Absent right patellar, trace AJs, left patellar 2+,   Toes:    The toes are downgoing bilaterally.   Clonus:    Clonus is absent.    Assessment/Plan: Patient here for new patient for epilepsy and migraines;  transfer of care from Dr. Merlene Laughter (06/26/2022) referred by pcp Tawni Carnes PA.  She has been extensively evaluated with MRI of the brain and cervical spine(not for several years however and symptoms have worsened) and lumbar spine and multiple EMGs with nerve conduction study.  She has a past medical history of epilepsy, migraines, seropositive rheumatoid arthritis, hemiplegia of unknown etiology, on amantadine for tremor. Difficult motor exam, poor effort, giveway, question functional quality, tremor may also have a functional quality to it. Splits midline to vibration. Right tremor distractable and variable. Increased tone right leg but no concurrent increase in reflex.  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 Continue topamax no recent "seizure-like" events - Given concerning symptoms including right reported vision changes, hemiparesis, hemisemsory loss, tremor, monocular viison loss needs MRI brain and cervical spine to rule out a multitude of disorders especially demyelinating disease or multiple sclerosis - Migraine: On Topamax and emgality, continue - Tremor: distractable and variable only occurs when watching, she can touch her face and performs actions without a tremor when distracted, appears functional, on amantadine - Dr Merlene Laughter gave her a year of refills, she can let us know when she needs refills. - Follow up with rheumatology (sees her in nov) - Has an eye doctor appointment  Orders Placed This Encounter  Procedures   MR BRAIN W WO CONTRAST   MR CERVICAL SPINE W WO CONTRAST    Cc: Rosalee Kaufman, *,   Rosalee Kaufman, [place]  Sarina Ill, MD  [facility] [address] [place], [place] [address]  Phone [phone] Fax [phone]

## 2022-06-27 ENCOUNTER — Encounter: Payer: Self-pay | Admitting: Neurology

## 2022-06-27 DIAGNOSIS — G8191 Hemiplegia, unspecified affecting right dominant side: Secondary | ICD-10-CM | POA: Insufficient documentation

## 2022-06-27 DIAGNOSIS — R251 Tremor, unspecified: Secondary | ICD-10-CM | POA: Insufficient documentation

## 2022-06-27 NOTE — Patient Instructions (Signed)
MRI brain and cervical spine Continue current management for tremor and migraines

## 2022-06-29 ENCOUNTER — Encounter: Payer: Self-pay | Admitting: Neurology

## 2022-07-01 ENCOUNTER — Telehealth: Payer: Self-pay | Admitting: Neurology

## 2022-07-01 NOTE — Telephone Encounter (Signed)
[employer] Josem Kaufmann: [id number] exp. 07/01/22-07/31/22 sent to GI [phone]

## 2022-07-16 ENCOUNTER — Ambulatory Visit
Admission: RE | Admit: 2022-07-16 | Discharge: 2022-07-16 | Disposition: A | Discharge status: Home / Self Care | Payer: BC Managed Care – PPO | Source: Ambulatory Visit | Attending: Neurology | Admitting: Neurology

## 2022-07-16 DIAGNOSIS — G8191 Hemiplegia, unspecified affecting right dominant side: Secondary | ICD-10-CM

## 2022-07-16 DIAGNOSIS — R200 Anesthesia of skin: Secondary | ICD-10-CM

## 2022-07-16 DIAGNOSIS — R498 Other voice and resonance disorders: Secondary | ICD-10-CM

## 2022-07-16 DIAGNOSIS — H5460 Unqualified visual loss, one eye, unspecified: Secondary | ICD-10-CM

## 2022-07-16 DIAGNOSIS — M6289 Other specified disorders of muscle: Secondary | ICD-10-CM

## 2022-07-16 DIAGNOSIS — R251 Tremor, unspecified: Secondary | ICD-10-CM

## 2022-07-16 DIAGNOSIS — R292 Abnormal reflex: Secondary | ICD-10-CM

## 2022-07-16 MED ORDER — GADOPICLENOL 0.5 MMOL/ML IV SOLN
7.0000 mL | Freq: Once | INTRAVENOUS | Status: AC | PRN
  Administered 2022-07-16: 7 mL via INTRAVENOUS

## 2022-07-17 ENCOUNTER — Ambulatory Visit: Payer: BC Managed Care – PPO | Admitting: Neurology

## 2022-08-28 ENCOUNTER — Encounter: Payer: Self-pay | Admitting: Neurology

## 2022-08-28 MED ORDER — EMGALITY 120 MG/ML ~~LOC~~ SOAJ: 120.0000 mg | SUBCUTANEOUS | 3 refills | Status: AC

## 2022-11-18 ENCOUNTER — Encounter: Payer: Self-pay | Admitting: Neurology

## 2022-11-18 NOTE — Telephone Encounter (Signed)
Located referral from Dr Merlene Laughter in media tab:

## 2022-11-18 NOTE — Telephone Encounter (Signed)
[facility] with [facility]. Their Gabapentin Rx on file is 300 mg capsules, take 1-2 at bedtime.

## 2022-11-19 ENCOUNTER — Other Ambulatory Visit: Payer: Self-pay | Admitting: Neurology

## 2022-11-19 MED ORDER — GABAPENTIN 300 MG PO CAPS: 300.0000 mg | ORAL_CAPSULE | Freq: Every day | ORAL | 2 refills | Status: DC

## 2023-01-15 ENCOUNTER — Ambulatory Visit: Payer: BC Managed Care – PPO | Admitting: Adult Health

## 2023-03-08 ENCOUNTER — Telehealth: Payer: Self-pay | Admitting: Pharmacy Technician

## 2023-03-08 NOTE — Telephone Encounter (Signed)
Pharmacy Patient Advocate Encounter  Received notification from [employer] that Prior Authorization for Emgality 120MG /ML auto-injectors (migraine) has been APPROVED from 03/08/2023 to 03/07/2024.Marland Kitchen  PA #/Case ID/Reference #: [id number]  Key: Dalbert Mayotte

## 2023-06-20 ENCOUNTER — Ambulatory Visit: Payer: BC Managed Care – PPO | Admitting: Urology

## 2023-06-20 VITALS — BP 123/72 | HR 65

## 2023-06-20 DIAGNOSIS — R310 Gross hematuria: Secondary | ICD-10-CM

## 2023-06-20 LAB — URINALYSIS, ROUTINE W REFLEX MICROSCOPIC
Bilirubin, UA: NEGATIVE
Glucose, UA: NEGATIVE
Ketones, UA: NEGATIVE
Leukocytes,UA: NEGATIVE
Nitrite, UA: NEGATIVE
Protein,UA: NEGATIVE
RBC, UA: NEGATIVE
Specific Gravity, UA: <1.005 — ABNORMAL LOW (ref 1.005–1.030)
Urobilinogen, Ur: 0.2 mg/dL (ref 0.2–1.0)
pH, UA: 6.5 (ref 5.0–7.5)

## 2023-06-20 LAB — BLADDER SCAN AMB NON-IMAGING: Scan Result: 138

## 2023-06-20 MED ORDER — GEMTESA 75 MG PO TABS: 1.0000 | ORAL_TABLET | Freq: Every day | ORAL | Status: DC

## 2023-06-20 NOTE — Progress Notes (Signed)
post void residual= 138

## 2023-06-20 NOTE — Progress Notes (Signed)
06/20/2023 12:01 PM   Ebony Espinoza 1976-08-15 [id number]  Referring provider: Royann Shivers, PA-C [address] [place],  [place] [address]  Hematuria   HPI: Ebony Espinoza is a 47yo here for evaluation of gross hematuria. She has right lower quadrant pain over the past 1-2 months. She had an episode of gross painless hematuria which has since resolved. The right lower quadrant pain started after the hematuria. She took antibiotics in 04/2023 for a presumed UTI. NO recent abdominal imaging. She has new urinary urgency, and urinary frequency every 30-60 minutes.    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   Pelvic pain in female 10/29/2014   S/P laparoscopic assisted vaginal hysterectomy (LAVH) 10/31/2014   Scoliosis    Shortness of breath dyspnea    related to asthma   Uterus, adenomyosis 10/29/2014   Vaginal delivery 2003, 2005    Surgical History: Past Surgical History:  Procedure Laterality Date   BILATERAL SALPINGECTOMY Bilateral 10/31/2014   Procedure: BILATERAL SALPINGECTOMY;  Surgeon: Sherian Rein, MD;  Location: [facility];  Service: Gynecology;  Laterality: Bilateral;  vaginal   CHOLECYSTECTOMY     LAPAROSCOPIC ASSISTED VAGINAL HYSTERECTOMY N/A 10/31/2014   Procedure: LAPAROSCOPIC ASSISTED VAGINAL HYSTERECTOMY;  Surgeon: Sherian Rein, MD;  Location: [facility];  Service: Gynecology;  Laterality: N/A;  vaginal    MICROLARYNGOSCOPY WITH CO2 LASER AND EXCISION OF VOCAL CORD LESION      Home Medications:  Allergies as of 06/20/2023       Reactions   Morphine And Codeine Itching   Oxycodone Itching, Rash   Sulfa Antibiotics Rash        Medication List        Accurate as of June 20, 2023 12:01 PM. If you have any questions, ask your nurse or doctor.          albuterol 108 (90 Base)  MCG/ACT inhaler Commonly known as: VENTOLIN HFA Inhale 2 puffs into the lungs every 6 (six) hours as needed for wheezing or shortness of breath.   amantadine 100 MG capsule Commonly known as: SYMMETREL Take 1 capsule by mouth daily.   beclomethasone 80 MCG/ACT inhaler Commonly known as: QVAR Inhale 2 puffs into the lungs 2 (two) times daily.   clobetasol cream 0.05 % Commonly known as: TEMOVATE APPLY 1 APPLICATION TO AFFECTED AREA TWICE A DAY   diclofenac Sodium 1 % Gel Commonly known as: VOLTAREN See admin instructions.   Emgality 120 MG/ML Soaj Generic drug: Galcanezumab-gnlm Inject 120 mg into the skin every 30 (thirty) days.   estradiol 0.1 mg/24hr patch Commonly known as: CLIMARA - Dosed in mg/24 hr Climara 0.1 mg/24 hr transdermal patch  Once patch a week   gabapentin 300 MG capsule Commonly known as: NEURONTIN Take 1-2 capsules (300-600 mg total) by mouth at bedtime.   ibuprofen 800 MG tablet Commonly known as: ADVIL Take 1 tablet (800 mg total) by mouth every 8 (eight) hours as needed (mild pain).   meloxicam 7.5 MG tablet Commonly known as: MOBIC Take 1 tablet by mouth 2 (two) times daily.   PARoxetine 20 MG tablet Commonly known as: PAXIL Take 20 mg by mouth daily.   Taltz 80 MG/ML pen Generic drug: ixekizumab INJECT 80 MG UNDER THE SKIN EVERY 4 WEEKS AS DIRECTED   topiramate 50 MG  tablet Commonly known as: TOPAMAX Take 50 mg by mouth 2 (two) times daily.   valACYclovir 500 MG tablet Commonly known as: VALTREX Take by mouth as needed (fever blisters).        Allergies:  Allergies  Allergen Reactions   Morphine And Codeine Itching   Oxycodone Itching and Rash   Sulfa Antibiotics Rash    Family History: Family History  Problem Relation Age of Onset   Prostate cancer Father    Migraines Paternal Grandmother    Myasthenia gravis Paternal Grandfather    Brain cancer Paternal Grandfather    High blood pressure Other    Parkinson's  disease Neg Hx     Social History:  reports that she has never smoked. She has never used smokeless tobacco. She reports that she does not drink alcohol and does not use drugs.  ROS: All other review of systems were reviewed and are negative except what is noted above in HPI  Physical Exam: BP 123/72   Pulse 65   Constitutional:  Alert and oriented, No acute distress. HEENT: Nice AT, moist mucus membranes.  Trachea midline, no masses. Cardiovascular: No clubbing, cyanosis, or edema. Respiratory: Normal respiratory effort, no increased work of breathing. GI: Abdomen is soft, nontender, nondistended, no abdominal masses GU: No CVA tenderness.  Lymph: No cervical or inguinal lymphadenopathy. Skin: No rashes, bruises or suspicious lesions. Neurologic: Grossly intact, no focal deficits, moving all 4 extremities. Psychiatric: Normal mood and affect.  Laboratory Data: Lab Results  Component Value Date   WBC 12.9 (H) 11/01/2014   HGB 10.9 (L) 11/01/2014   HCT 31.5 (L) 11/01/2014   MCV 88.2 11/01/2014   PLT 197 11/01/2014    Lab Results  Component Value Date   CREATININE 0.69 11/01/2014    No results found for: "PSA"  No results found for: "TESTOSTERONE"  No results found for: "HGBA1C"  Urinalysis    Component Value Date/Time   COLORURINE STRAW (A) 07/16/2017 1130   APPEARANCEUR CLEAR 07/16/2017 1130   LABSPEC 1.011 07/16/2017 1130   PHURINE 5.0 07/16/2017 1130   GLUCOSEU NEGATIVE 07/16/2017 1130   HGBUR SMALL (A) 07/16/2017 1130   BILIRUBINUR NEGATIVE 07/16/2017 1130   KETONESUR NEGATIVE 07/16/2017 1130   PROTEINUR NEGATIVE 07/16/2017 1130   NITRITE NEGATIVE 07/16/2017 1130   LEUKOCYTESUR NEGATIVE 07/16/2017 1130    Lab Results  Component Value Date   BACTERIA NONE SEEN 07/16/2017    Pertinent Imaging:  No results found for this or any previous visit.  No results found for this or any previous visit.  No results found for this or any previous visit.  No  results found for this or any previous visit.  No results found for this or any previous visit.  No valid procedures specified. No results found for this or any previous visit.  No results found for this or any previous visit.   Assessment & Plan:    1. Gross hematuria CT hematuria -office cystoscopy - Urinalysis, Routine w reflex microscopic - BLADDER SCAN AMB NON-IMAGING  2. OAB: We will trial gemtesa 75mg  daily   No follow-ups on file.  Wilkie Aye, MD  [facility] [place]

## 2023-06-21 LAB — BASIC METABOLIC PANEL
BUN/Creatinine Ratio: 18 (ref 9–23)
BUN: 11 mg/dL (ref 6–24)
CO2: 20 mmol/L (ref 20–29)
Calcium: 9.9 mg/dL (ref 8.7–10.2)
Chloride: 107 mmol/L — ABNORMAL HIGH (ref 96–106)
Creatinine, Ser: 0.62 mg/dL (ref 0.57–1.00)
Glucose: 92 mg/dL (ref 70–99)
Potassium: 4.3 mmol/L (ref 3.5–5.2)
Sodium: 143 mmol/L (ref 134–144)
eGFR: 110 mL/min/{1.73_m2} (ref >59)

## 2023-06-24 ENCOUNTER — Encounter: Payer: Self-pay | Admitting: Urology

## 2023-06-24 NOTE — Patient Instructions (Signed)

## 2023-06-27 ENCOUNTER — Ambulatory Visit (HOSPITAL_COMMUNITY)
Admission: RE | Admit: 2023-06-27 | Discharge: 2023-06-27 | Disposition: A | Discharge status: Home / Self Care | Payer: BC Managed Care – PPO | Source: Ambulatory Visit | Attending: Urology | Admitting: Urology

## 2023-06-27 DIAGNOSIS — R310 Gross hematuria: Secondary | ICD-10-CM | POA: Diagnosis present

## 2023-06-27 MED ORDER — IOHEXOL 300 MG/ML  SOLN
100.0000 mL | Freq: Once | INTRAMUSCULAR | Status: AC | PRN
  Administered 2023-06-27: 125 mL via INTRAVENOUS

## 2023-07-24 ENCOUNTER — Telehealth: Payer: Self-pay

## 2023-07-24 MED ORDER — GEMTESA 75 MG PO TABS: 1.0000 | ORAL_TABLET | Freq: Every day | ORAL | 11 refills | Status: DC

## 2023-07-24 NOTE — Telephone Encounter (Signed)
Rx sent 

## 2023-07-24 NOTE — Telephone Encounter (Signed)
Patient is needing a refill of Gemtesa.  Also, asking if she will need to continue taking Rx.  [id number] [place], [place] - [address] Phone: [phone]  Fax: [phone]      Please advise.

## 2023-08-15 ENCOUNTER — Ambulatory Visit (INDEPENDENT_AMBULATORY_CARE_PROVIDER_SITE_OTHER): Payer: BC Managed Care – PPO | Admitting: Urology

## 2023-08-15 VITALS — BP 112/72 | HR 59

## 2023-08-15 DIAGNOSIS — R310 Gross hematuria: Secondary | ICD-10-CM | POA: Diagnosis not present

## 2023-08-15 LAB — MICROSCOPIC EXAMINATION

## 2023-08-15 LAB — URINALYSIS, ROUTINE W REFLEX MICROSCOPIC
Bilirubin, UA: NEGATIVE
Glucose, UA: NEGATIVE
Ketones, UA: NEGATIVE
Nitrite, UA: NEGATIVE
Protein,UA: NEGATIVE
RBC, UA: NEGATIVE
Specific Gravity, UA: 1.015 (ref 1.005–1.030)
Urobilinogen, Ur: 0.2 mg/dL (ref 0.2–1.0)
pH, UA: 7 (ref 5.0–7.5)

## 2023-08-15 MED ORDER — GEMTESA 75 MG PO TABS
1.0000 | ORAL_TABLET | Freq: Every day | ORAL | 11 refills | Status: DC
Start: 2023-08-15 — End: 2023-11-20

## 2023-08-15 MED ORDER — CIPROFLOXACIN HCL 500 MG PO TABS
500.0000 mg | ORAL_TABLET | Freq: Once | ORAL | Status: AC
Start: 2023-08-15 — End: 2023-08-15
  Administered 2023-08-15: 500 mg via ORAL

## 2023-08-15 NOTE — Progress Notes (Unsigned)
   08/15/23  CC: hematuria   HPI: Ebony Espinoza is a 47yo here for cystoscopy for gross hematuri Blood pressure 112/72, pulse (!) 59. NED. A&Ox3.   No respiratory distress   Abd soft, NT, ND Normal external genitalia with patent urethral meatus  Cystoscopy Procedure Note  Patient identification was confirmed, informed consent was obtained, and patient was prepped using Betadine solution.  Lidocaine jelly was administered per urethral meatus.    Procedure: - Flexible cystoscope introduced, without any difficulty.   - Thorough search of the bladder revealed:    normal urethral meatus    normal urothelium    no stones    no ulcers     no tumors    no urethral polyps    no trabeculation She had mild urethral stenosis which was dilated with the cystoscope  - Ureteral orifices were normal in position and appearance.  Post-Procedure: - Patient tolerated the procedure well  Assessment/ Plan: Continue gemtesa 75mg . Patient to call if here urine stream improves after dilation. If it does we will scheduled her for cystoscooy with dilation in the OR   No follow-ups on file.  Wilkie Aye, MD

## 2023-08-18 ENCOUNTER — Encounter: Payer: Self-pay | Admitting: Urology

## 2023-08-19 ENCOUNTER — Encounter: Payer: Self-pay | Admitting: Urology

## 2023-08-19 NOTE — Patient Instructions (Signed)
Urethral Stricture  Urethral stricture is when the tube that drains pee (urine) from the bladder out of the body (urethra) becomes too narrow. The urethra can become narrow because of scar tissue, infection, surgery, or an injury. This can make it difficult to pee (urinate). In females, the urethra opens above the vaginal opening. In males, the urethra opens at the tip of the penis, and the urethra is much longer than it is in females. Because of the length of the female urethra, urethral stricture is much more common in males. What are the causes? In males and females, common causes of urethral stricture include: Urinary tract infection (UTI). Sexually transmitted infection (STI). Using a soft tube in the urethra to drain pee from the bladder (urinary catheter). Urinary tract surgery. In males, common causes of urethral stricture include: A severe injury to the pelvis. Prostate surgery. Injury to the penis. In many cases, the cause of urethral stricture is not known. What increases the risk? You are more likely to develop this condition if you: Are female. Males who have had prostate surgery are at risk of developing this condition. Use a urinary catheter. Have had urinary tract surgery. What are the signs or symptoms? The main symptom of this condition is trouble peeing. This may cause decreased pee flow, dribbling, or spraying of pee. Other symptom of this condition may include: Frequent UTIs. Blood in the pee. Pain when peeing. Swelling of the penis in males. Not being able to pee. How is this diagnosed? This condition may be diagnosed based on: Your medical history and a physical exam. Tests of your pee to check for infection or bleeding. X-rays. Ultrasound. Retrograde urethrogram. With this test, a dye is injected into the urethra and then an X-ray is taken. Urethroscopy. This is when a thin tube with a light and camera on the end (urethroscope) is used to look at the urethra. A  CT scan or MRI. How is this treated? This condition is treated with surgery or other procedures. The type of surgery that you have depends on the severity of your condition. You may have: Urethral dilation. In this procedure, the narrow part of the urethra is stretched open (dilated) with dilating instruments or a small balloon. Urethrotomy. In this procedure, a urethroscope is placed into the urethra, and the narrow part of the urethra is cut open with a surgical blade or laser inserted through the urethroscope. Urethroplasty. In this procedure, an incision is made in the urethra and the narrow part is removed. Then, the urethra is reconstructed. Follow these instructions at home: Take over-the-counter and prescription medicines only as told by your health care provider. If you were prescribed antibiotics, take them as told by your provider. Do not stop using the antibiotic even if you start to feel better. Drink enough fluid to keep your pee pale yellow. Keep all follow-up visits. Your provider will check your healing and adjust your treatment plan as needed. Contact a health care provider if: You have frequent peeing or you are only peeing small amounts often. You feel the need to pee urgently. You have pain or burning when you pee. Your pee smells bad or unusual. Your pee is bloody or cloudy. You have pain in your lower abdomen or back. Your genital area is swollen, bruised, or discolored. This includes: The penis, scrotum, and inner thighs for males. The outer genital organs (vulva) and inner thighs for females. You have a fever. You develop swelling in your legs. Get help  right away if: You cannot pee. You have trouble breathing. These symptoms may be an emergency. Get help right away. Call 911. Do not wait to see if the symptoms will go away. Do not drive yourself to the hospital. This information is not intended to replace advice given to you by your health care provider. Make  sure you discuss any questions you have with your health care provider. Document Revised: 06/20/2022 Document Reviewed: 06/20/2022 Elsevier Patient Education  2024 [employer].

## 2023-11-19 ENCOUNTER — Encounter: Payer: Self-pay | Admitting: Urology

## 2023-11-20 ENCOUNTER — Other Ambulatory Visit: Payer: Self-pay

## 2023-11-20 DIAGNOSIS — R310 Gross hematuria: Secondary | ICD-10-CM

## 2023-11-20 MED ORDER — GEMTESA 75 MG PO TABS: 1.0000 | ORAL_TABLET | Freq: Every day | ORAL | 3 refills | Status: DC

## 2024-02-09 ENCOUNTER — Telehealth: Payer: Self-pay

## 2024-02-09 NOTE — Telephone Encounter (Signed)
 Pharmacy Patient Advocate Encounter   Received notification from CoverMyMeds that prior authorization for Emgality  120MG /ML auto-injectors (migraine) is due for renewal.   Insurance verification completed.   The patient is insured through [employer].  Action: Patient hasn't been seen in your office in over a year. Plan requires updated chart notes for PA renewal.

## 2024-02-13 ENCOUNTER — Ambulatory Visit: Payer: BC Managed Care – PPO | Admitting: Urology

## 2024-03-30 NOTE — Progress Notes (Signed)
 [facility]CCUPATIONAL THERAP[facility] 03/30/2024  Patient Name: Ebony Espinoza Date of Birth:1976/07/07 Diagnosis:  Encounter Diagnoses  Name Primary?  . Decreased strength of upper extremity Yes  . Muscular incoordination   . Decreased range of motion of right shoulder     Date of Evaluation: 03/30/24 Onset of Symptoms: 09/10/23 Referring MD: Comer Raymond, [facility] Chief Complaint/Reason for Referral: Pt is a a 48 year old female who reports that BUE weakness. Pt reports that it has been worsening in the past 6 months but has been present prior to that.  Pt reports no known catalyst for increased weakness.  Pt reports that she does see a rhumatologist. She will undergo an EMG 04/23/24. Pt reports [facility] to address BLE/back. She has been going to physical therapy since March of this year. Pt does have a medical dx of psoriatic arthritis.   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 Pt is seeking to incresae BUE strength and coordination for functional use. Dates of Certification: 03/30/24 - 05/25/24  ASSESSMENT:    Assessment: Pt is a 48 year old female with a medical dx of decreased strength of upper extremity and treatment dx of decreased strength of upper extremity, decreased ROM of R shoulder,  and muscular incoordination. Pt presents with body system impairments including muscle weakness, pain, lack of endurance, decreased ROM, lack of coordination that impair her ADL, IADL, recreation, and overall quality of life. Pt would benefit from outpatient OT to address the above mentioned concerns in order to reach highest level of function.  Problem List: Decreased strength, Decreased range of motion, Decreased endurance, Decreased coordination, Pain, Impaired ADLs, Impaired sensation, Imparied fine motor skills, Decreased  mobility  Personal Factors/Comorbidities Present: 2   Performance deficits that result in activity limitations and/or participation restrictions: 1-3 elements       Clinical Decision Making: Moderate Prognosis: Good Positive Prognosis Rationale: Good caregiver/family support Negative Prognosis Rationale: Pain status, Functional strength deficits, symptom severity, Chronicity of condition     Short Term Goals:      Short Term Goal 1: Pt will increase BUE strength by 1 MMT3/3 trials  in 4 weeks to increase ADL/IADL function. Time Frame : 4 weeks  Short Term Goal 2: Pt will increase B hand strength by 5 psi 3/3 trials in 4 weeks to increase ADL/IADL function.       Short Term Goal 3: Pt will increase coordination in R hand to feed self independently with 50% success or more 3/3 trials.       Short Term Goal 4: Pt will increase AROM in R shoulder by 15 degrees to increase ADL/IADL function in 4 weeks.             Long Term Goals: Long Term Goal 1: Pt will increase BUE strength by 2 MMT in 8 weeks to increase ADL/IADL function.  Time Frame: 8 weeks  Long Term Goal 2: Pt will increase B hand strength by 5 psi 3/3 trials in 4 weeks to increase ADL/IADL function.       Long Term Goal 3: Pt will increase AROM in R shoulder by 30 degrees to increase ADL/IADL function in 8 weeks.       Long Term Goal 4: Pt will increase coordination in B hand in 8 weeks to open containers 3/3 trials.  PLAN:   (1-2x per week)   for 8 weeks  Planned Interventions: Therapeutic activity, Sensory based strategies, Home exercise program, Energy Conservation, Endurance activities, Estate manager/land agent, Adaptive equipment, Education - Patient, Functional mobility, Self-care/Home training, Manual therapy, Task adaptation strategy training, Self-management strategies, ADL retraining, Modalities, Therapeutic exercise, Compression Therapy, Splinting   DME/Equipment Recommendations: Brace / Splint     SUBJECTIVE: Pt reports that she has arthritis everywhere.  Pt pain in R shoulder, R hand. Pt reports that she has intermittent pain in UE but the RUE is the worst. Pt reports that she has to do everything with her L hand now. Prior Functional Status: No functional limitations   Previous Treatment: PT   Vocation: On disability    Red Flags: None Surgical Procedure?: No           Wound: none    Current Functional Status: Limited sitting tolerance, Limited standing tolerance, Limited walking tolerance, Limited lifting, Limited household activities, Disturbed Sleep, Limited recreation, Use of assistive device, Limited ADLs    Communication Preference: Verbal, Written, Visual Barriers to Learning: No Barriers Accompanied by: Family  Precautions: Seizure (No use of TENS)          Living Environment: Single story house    Lives With: Family    Home Living: Ramped entrance Equipment currently used: Wheelchair - manual, Standard walker, Single point cane, Shower chair     Roles: Parent, Spouse Leisure / Play: not engaging routinely    Past Medical History[1] Family History[2] Past Surgical History[3]  Allergies  Allergen Reactions  . Sulfa (Sulfonamide Antibiotics) Itching, Other (See Comments), Rash and Hives  . Morphine Itching  . Opioids - Morphine Analogues Itching  . Oxycodone  Itching  . Hydrocodone-Acetaminophen  Rash   Social History   Tobacco Use  . Smoking status: Never  . Smokeless tobacco: Never  Substance Use Topics  . Alcohol use: Never   Current Medications[4]  Recent Procedures/Tests/Findings:    Pain: Yes Location: RUE  Current Pain Level: 8 Max Pain Level: 10 Least Pain Level: 7  Pain Frequency: Constant/continuous  Pain aggravated by: as the day progresses, sleeping (movement)  Pain relieved by: massage Pain Descriptors: Burning, Aching, Stabbing        OBJECTIVE Objective: Guarding noted of RUE intermittently with relaxing and  stiffening of R hand. RUE ROM: Shoulder elevation intact, Shoulder flexion: 90 degrees, flexion WFL, extension limited to 30 degrees from 150, wrist flexion/ extension: WFL, Hand: pt made a fist during session but not upon request, full extension upon request. LUE: shoulder flexion: 120, elbow flexion/extension: WFL, wrist flexion/extension: WFL, hand: flexion/extension: WFL for fisting. Pinch dynamometer: pincer: 2psi, 3 jaw chuck: 2 psi, lateral: 2 psi     9 hole peg test: R: 70.31 seconds  L: 27.68 seconds     Hand Dominance: Right  Grasp Patterns: Whole hand grasp, Hook grasp, Pinch grasp  Strength: MMT -  RUE:  0/5  LUE: 2+/5  Dynamometer:  R: 3, 0, 0  (please note fisted grasp achieved during testing)  L: 15, 0, 0  ADLs: Needs assistance with ADLs   Feeding - Needs Assistance: Min assist   Grooming - Needs Assistance: Mod assist    Bathing - Needs Assistance: Physical assistance required, Min assist    Toileting - Needs Assistance: Physical assistance required, Min assist   UB Dressing - Needs Assistance: Min assist, Physical assistance required   LB Dressing - Needs Assistance: Physical assistance required, Mod assist   Transfer Assistance Needed:  Yes   Transfers - Needs Assistance: Mod assist   Bed Mobility Assistance Needed: Yes   Bed Mobility - Needs Assistance: Physical assistance required, Mod assist   Homemaking: Not performing routinely   Cleaning: Pt reports that she is not able to perform.   Laundry: Pt reports that she is not able to perform.   Meal Prep: Not performing routinely   Medication Management: Physical assistance required (Pt unable to open bottles.)   Shopping: Not performing routinely   Caregiving: Not Responsible to complete       Driving & Community Mobility: Physical assistance required (Pt unable to complete)   Rest/Sleep : Impaired   Occupational Profile Summary: 2/80    Orientation Level: Oriented x 4   Arousal/Alertness: Appropriate responses to stimuli    Attention Span: Appears intact   Memory: Appears intact   Following Commands: Follows all commands and directions without difficulty   Safety Judgment: Good awareness of safety precautions  Awareness of Errors: Good awareness of safety precautions   Problem Solving: Able to problem solve independently       Vision: Wears glasses all the time       Perception/Sensation/Motor Function Comment: Pt reports numbness/tingling that is off and on. Pt reports no specific time that she notices it more or less. Pt reports that is starts in the hand and radiates to shoulder.   Psychosocial: engaged    Education Provided: HEP, importance of therapy, equipment recommendations, treatment options and plan Education results: Verbalized understanding    Total Time: 83  Today's Charges (noted here with $$): Initial evaluation     I attest that I have reviewed the above information. Signed: Lauraine JAYSON Custard, OT 03/30/2024 12:14 PM      [1] Past Medical History: Diagnosis Date  . Arthritis   . Asthma (HHS-HCC)   [2] History reviewed. No pertinent family history. [3] No past surgical history on file. [4] Current Outpatient Medications  Medication Sig Dispense Refill  . albuterol  HFA 90 mcg/actuation inhaler Inhale 2 puffs every six (6) hours as needed for wheezing.    . fluticasone  propionate (FLONASE ) 50 mcg/actuation nasal spray 2 sprays into each nostril daily. 16 g 0  . meloxicam (MOBIC) 7.5 MG tablet Take 7.5 mg by mouth daily.    [name] PAXLOVID CO-PACK, EUA, (PAXLOVID CO-PACK, EUA,) 300 mg (150 mg x 2)-100 mg tablet See package instructions. 30 tablet 0  . predniSONE (DELTASONE) 20 MG tablet 3 po daily for 2 days, then 2 po daily for 2 days, then 1 po daily for 2 days 12 tablet 0  . topiramate  (TOPAMAX ) 100 MG tablet Take 100 mg by mouth Two (2) times a day.     No current facility-administered medications for this visit.

## 2024-04-02 ENCOUNTER — Ambulatory Visit: Admitting: Urology

## 2024-04-02 VITALS — BP 103/66 | HR 66

## 2024-04-02 DIAGNOSIS — R310 Gross hematuria: Secondary | ICD-10-CM

## 2024-04-02 DIAGNOSIS — N3281 Overactive bladder: Secondary | ICD-10-CM

## 2024-04-02 LAB — URINALYSIS, ROUTINE W REFLEX MICROSCOPIC
Bilirubin, UA: NEGATIVE
Glucose, UA: NEGATIVE
Ketones, UA: NEGATIVE
Leukocytes,UA: NEGATIVE
Nitrite, UA: NEGATIVE
Protein,UA: NEGATIVE
RBC, UA: NEGATIVE
Specific Gravity, UA: 1.01 (ref 1.005–1.030)
Urobilinogen, Ur: 0.2 mg/dL (ref 0.2–1.0)
pH, UA: 6.5 (ref 5.0–7.5)

## 2024-04-02 MED ORDER — GEMTESA 75 MG PO TABS: 1.0000 | ORAL_TABLET | Freq: Every day | ORAL | 11 refills | Status: DC

## 2024-04-02 NOTE — Progress Notes (Signed)
 04/02/2024 12:44 PM   Ebony Espinoza 1976/01/11 [id number]  Referring provider: Skillman, Katherine E, PA-C [address] [place],  [place] [address]   Followup OAB  HPI: Ms Goodlin is a 48yo here for followup for OAB. She is on gemtesa  75mg  daily. She denies any worsening urinary incontinence. She denies any hematuria or dysuria. No issues with urinary retention. No UTI since last visit.    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   Pelvic pain in female 10/29/2014   S/P laparoscopic assisted vaginal hysterectomy (LAVH) 10/31/2014   Scoliosis    Shortness of breath dyspnea    related to asthma   Uterus, adenomyosis 10/29/2014   Vaginal delivery 2003, 2005    Surgical History: Past Surgical History:  Procedure Laterality Date   BILATERAL SALPINGECTOMY Bilateral 10/31/2014   Procedure: BILATERAL SALPINGECTOMY;  Surgeon: Ezzie Buba, MD;  Location: [facility];  Service: Gynecology;  Laterality: Bilateral;  vaginal   CHOLECYSTECTOMY     LAPAROSCOPIC ASSISTED VAGINAL HYSTERECTOMY N/A 10/31/2014   Procedure: LAPAROSCOPIC ASSISTED VAGINAL HYSTERECTOMY;  Surgeon: Ezzie Buba, MD;  Location: [facility];  Service: Gynecology;  Laterality: N/A;  vaginal    MICROLARYNGOSCOPY WITH CO2 LASER AND EXCISION OF VOCAL CORD LESION      Home Medications:  Allergies as of 04/02/2024       Reactions   Morphine And Codeine Itching   Oxycodone  Itching, Rash   Sulfa Antibiotics Rash        Medication List        Accurate as of April 02, 2024 12:44 PM. If you have any questions, ask your nurse or doctor.          albuterol  108 (90 Base) MCG/ACT inhaler Commonly known as: VENTOLIN  HFA Inhale 2 puffs into the lungs every 6 (six) hours as needed for wheezing or shortness of breath.   amantadine 100 MG capsule Commonly  known as: SYMMETREL Take 1 capsule by mouth daily.   beclomethasone 80 MCG/ACT inhaler Commonly known as: QVAR Inhale 2 puffs into the lungs 2 (two) times daily.   clobetasol cream 0.05 % Commonly known as: TEMOVATE APPLY 1 APPLICATION TO AFFECTED AREA TWICE A DAY   diclofenac Sodium 1 % Gel Commonly known as: VOLTAREN See admin instructions.   Emgality  120 MG/ML Soaj Generic drug: Galcanezumab -gnlm Inject 120 mg into the skin every 30 (thirty) days.   estradiol 0.1 mg/24hr patch Commonly known as: CLIMARA - Dosed in mg/24 hr Climara 0.1 mg/24 hr transdermal patch  Once patch a week   Gemtesa  75 MG Tabs Generic drug: Vibegron  Take 1 tablet (75 mg total) by mouth daily.   Gemtesa  75 MG Tabs Generic drug: Vibegron  Take 1 tablet (75 mg total) by mouth daily.   ibuprofen  800 MG tablet Commonly known as: ADVIL  Take 1 tablet (800 mg total) by mouth every 8 (eight) hours as needed (mild pain).   meloxicam 7.5 MG tablet Commonly known as: MOBIC Take 1 tablet by mouth 2 (two) times daily.   PARoxetine 20 MG tablet Commonly known as: PAXIL Take 20 mg by mouth daily.   Taltz 80 MG/ML pen Generic drug: ixekizumab INJECT 80 MG UNDER THE SKIN EVERY 4 WEEKS AS DIRECTED   topiramate  50 MG tablet Commonly known as: TOPAMAX  Take 50 mg by mouth 2 (two)  times daily.   valACYclovir 500 MG tablet Commonly known as: VALTREX Take by mouth as needed (fever blisters).        Allergies:  Allergies  Allergen Reactions   Morphine And Codeine Itching   Oxycodone  Itching and Rash   Sulfa Antibiotics Rash    Family History: Family History  Problem Relation Age of Onset   Prostate cancer Father    Migraines Paternal Grandmother    Myasthenia gravis Paternal Grandfather    Brain cancer Paternal Grandfather    High blood pressure Other    Parkinson's disease Neg Hx     Social History:  reports that she has never smoked. She has never used smokeless tobacco. She reports  that she does not drink alcohol and does not use drugs.  ROS: All other review of systems were reviewed and are negative except what is noted above in HPI  Physical Exam: BP 103/66   Pulse 66   Constitutional:  Alert and oriented, No acute distress. HEENT: [place] AT, moist mucus membranes.  Trachea midline, no masses. Cardiovascular: No clubbing, cyanosis, or edema. Respiratory: Normal respiratory effort, no increased work of breathing. GI: Abdomen is soft, nontender, nondistended, no abdominal masses GU: No CVA tenderness.  Lymph: No cervical or inguinal lymphadenopathy. Skin: No rashes, bruises or suspicious lesions. Neurologic: Grossly intact, no focal deficits, moving all 4 extremities. Psychiatric: Normal mood and affect.  Laboratory Data: Lab Results  Component Value Date   WBC 12.9 (H) 11/01/2014   HGB 10.9 (L) 11/01/2014   HCT 31.5 (L) 11/01/2014   MCV 88.2 11/01/2014   PLT 197 11/01/2014    Lab Results  Component Value Date   CREATININE 0.62 06/20/2023    No results found for: PSA  No results found for: TESTOSTERONE  No results found for: HGBA1C  Urinalysis    Component Value Date/Time   COLORURINE STRAW (A) 07/16/2017 1130   APPEARANCEUR Hazy (A) 08/15/2023 1156   LABSPEC 1.011 07/16/2017 1130   PHURINE 5.0 07/16/2017 1130   GLUCOSEU Negative 08/15/2023 1156   HGBUR SMALL (A) 07/16/2017 1130   BILIRUBINUR Negative 08/15/2023 1156   KETONESUR NEGATIVE 07/16/2017 1130   PROTEINUR Negative 08/15/2023 1156   PROTEINUR NEGATIVE 07/16/2017 1130   NITRITE Negative 08/15/2023 1156   NITRITE NEGATIVE 07/16/2017 1130   LEUKOCYTESUR Trace (A) 08/15/2023 1156    Lab Results  Component Value Date   LABMICR See below: 08/15/2023   WBCUA 0-5 08/15/2023   LABEPIT 0-10 08/15/2023   BACTERIA Moderate (A) 08/15/2023    Pertinent Imaging:  No results found for this or any previous visit.  No results found for this or any previous visit.  No results  found for this or any previous visit.  No results found for this or any previous visit.  No results found for this or any previous visit.  No results found for this or any previous visit.  Results for orders placed during the hospital encounter of 06/27/23  CT HEMATURIA WORKUP  Narrative CLINICAL DATA:  Gross hematuria.  EXAM: CT ABDOMEN AND PELVIS WITHOUT AND WITH CONTRAST  TECHNIQUE: Multidetector CT imaging of the abdomen and pelvis was performed following the standard protocol before and following the bolus administration of intravenous contrast.  RADIATION DOSE REDUCTION: This exam was performed according to the departmental dose-optimization program which includes automated exposure control, adjustment of the mA and/or kV according to patient size and/or use of iterative reconstruction technique.  CONTRAST:  125mL OMNIPAQUE  IOHEXOL  300 MG/ML  SOLN  COMPARISON:  None Available.  FINDINGS: Lower chest: No acute findings. Heart size normal. No pericardial or pleural effusion. Distal esophagus is grossly unremarkable.  Hepatobiliary: Probable millimetric cyst in the dome of the liver. Liver is otherwise unremarkable. Cholecystectomy. No unexpected biliary ductal dilatation.  Pancreas: Negative.  Spleen: Negative.  Adrenals/Urinary Tract: Adrenal glands and kidneys are unremarkable. No urinary stones. Ureters are decompressed. No filling defects in the intrarenal collecting systems, ureters or bladder.  Stomach/Bowel: Stomach, small bowel, appendix and colon are unremarkable. Fair amount of stool in the colon suggests constipation.  Vascular/Lymphatic: Vascular structures are unremarkable. No pathologically enlarged lymph nodes.  Reproductive: Hysterectomy.  No adnexal mass.  Other: Small bilateral inguinal hernias contain fat. No free fluid. Mesenteries and peritoneum are unremarkable.  Musculoskeletal: Degenerative changes in the spine. No  worrisome lytic or sclerotic lesions.  IMPRESSION: No findings to explain the patient's hematuria.   Electronically Signed By: Newell Eke M.D. On: 06/27/2023 13:06  No results found for this or any previous visit.   Assessment & Plan:    1. Gross hematuria (Primary) Resolved  - Urinalysis, Routine w reflex microscopic  2. OAB (overactive bladder) Continue gemtesa  75mg  daily.    No follow-ups on file.  Belvie Clara, MD  [facility] [place]

## 2024-04-07 ENCOUNTER — Encounter: Payer: Self-pay | Admitting: Urology

## 2024-04-07 NOTE — Patient Instructions (Signed)

## 2024-04-23 ENCOUNTER — Encounter: Payer: Self-pay | Admitting: Urology

## 2024-04-28 ENCOUNTER — Other Ambulatory Visit: Payer: Self-pay

## 2024-04-28 MED ORDER — TROSPIUM CHLORIDE ER 60 MG PO CP24: 60.0000 mg | ORAL_CAPSULE | Freq: Every day | ORAL | 11 refills | Status: DC

## 2024-05-21 ENCOUNTER — Other Ambulatory Visit: Payer: Self-pay

## 2024-05-21 DIAGNOSIS — N3281 Overactive bladder: Secondary | ICD-10-CM

## 2024-05-23 ENCOUNTER — Encounter: Payer: Self-pay | Admitting: Urology

## 2024-05-24 ENCOUNTER — Other Ambulatory Visit: Payer: Self-pay

## 2024-05-24 MED ORDER — TROSPIUM CHLORIDE ER 60 MG PO CP24: 60.0000 mg | ORAL_CAPSULE | Freq: Every day | ORAL | 11 refills | Status: DC

## 2024-05-24 NOTE — Telephone Encounter (Signed)
 Okay for pt to have a 3 month supply of Rx ??

## 2024-05-26 ENCOUNTER — Encounter: Payer: Self-pay | Admitting: Urology

## 2024-05-26 ENCOUNTER — Other Ambulatory Visit: Payer: Self-pay

## 2024-05-26 MED ORDER — TROSPIUM CHLORIDE ER 60 MG PO CP24: 60.0000 mg | ORAL_CAPSULE | Freq: Every day | ORAL | 3 refills | Status: DC

## 2024-08-30 NOTE — Telephone Encounter (Signed)
 Spoke with Ebony Espinoza about the denial for Botox procedures due to muscle ridigity to see if they are denying the whole procedure or the # of units requested.  The procedure has been denied due to medical reasoning and states a peer to peer form can be submitted at [email or web address] if needed.  Provider CvD has advised the peer to peer will not be needed.   Ebony Espinoza, CMA 08/30/2024 10:25 AM

## 2024-10-08 ENCOUNTER — Ambulatory Visit: Admitting: Urology

## 2024-10-08 VITALS — BP 112/71 | HR 69

## 2024-10-08 DIAGNOSIS — N3281 Overactive bladder: Secondary | ICD-10-CM

## 2024-10-08 MED ORDER — TROSPIUM CHLORIDE ER 60 MG PO CP24: 60.0000 mg | ORAL_CAPSULE | Freq: Every day | ORAL | 3 refills | Status: AC

## 2024-10-08 MED ORDER — TROSPIUM CHLORIDE ER 60 MG PO CP24: 60.0000 mg | ORAL_CAPSULE | Freq: Every day | ORAL | 3 refills | Status: DC

## 2024-10-08 NOTE — Progress Notes (Unsigned)
 "  10/08/2024 12:06 PM   Ebony Espinoza 1976/04/20 [id number]  Referring provider: Jeanette Comer BRAVO, PA-C [address] [place],  [place] [address]  No chief complaint on file.   HPI: Ebony Espinoza is a 49yo here for followup for OAB. She is on trospium  60mg  daily which works well for her urgency and her incontinence. She uses 1-2 pads per day. 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   Pelvic pain in female 10/29/2014   S/P laparoscopic assisted vaginal hysterectomy (LAVH) 10/31/2014   Scoliosis    Shortness of breath dyspnea    related to asthma   Uterus, adenomyosis 10/29/2014   Vaginal delivery 2003, 2005    Surgical History: Past Surgical History:  Procedure Laterality Date   BILATERAL SALPINGECTOMY Bilateral 10/31/2014   Procedure: BILATERAL SALPINGECTOMY;  Surgeon: Ezzie Buba, MD;  Location: [facility];  Service: Gynecology;  Laterality: Bilateral;  vaginal   CHOLECYSTECTOMY     LAPAROSCOPIC ASSISTED VAGINAL HYSTERECTOMY N/A 10/31/2014   Procedure: LAPAROSCOPIC ASSISTED VAGINAL HYSTERECTOMY;  Surgeon: Ezzie Buba, MD;  Location: [facility];  Service: Gynecology;  Laterality: N/A;  vaginal    MICROLARYNGOSCOPY WITH CO2 LASER AND EXCISION OF VOCAL CORD LESION      Home Medications:  Allergies as of 10/08/2024       Reactions   Morphine And Codeine Itching   Oxycodone  Itching, Rash   Sulfa Antibiotics Rash        Medication List        Accurate as of October 08, 2024 12:06 PM. If you have any questions, ask your nurse or doctor.          amantadine 100 MG capsule Commonly known as: SYMMETREL Take 1 capsule by mouth daily. The timing of this medication is very important.   albuterol  108 (90 Base) MCG/ACT inhaler Commonly known as: VENTOLIN  HFA Inhale 2 puffs into the lungs  every 6 (six) hours as needed for wheezing or shortness of breath.   beclomethasone 80 MCG/ACT inhaler Commonly known as: QVAR Inhale 2 puffs into the lungs 2 (two) times daily.   clobetasol cream 0.05 % Commonly known as: TEMOVATE APPLY 1 APPLICATION TO AFFECTED AREA TWICE A DAY   diclofenac Sodium 1 % Gel Commonly known as: VOLTAREN See admin instructions.   Emgality  120 MG/ML Soaj Generic drug: Galcanezumab -gnlm Inject 120 mg into the skin every 30 (thirty) days.   estradiol 0.1 mg/24hr patch Commonly known as: CLIMARA - Dosed in mg/24 hr Climara 0.1 mg/24 hr transdermal patch  Once patch a week   ibuprofen  800 MG tablet Commonly known as: ADVIL  Take 1 tablet (800 mg total) by mouth every 8 (eight) hours as needed (mild pain).   meloxicam 7.5 MG tablet Commonly known as: MOBIC Take 1 tablet by mouth 2 (two) times daily.   PARoxetine 20 MG tablet Commonly known as: PAXIL Take 20 mg by mouth daily.   Taltz 80 MG/ML pen Generic drug: ixekizumab INJECT 80 MG UNDER THE SKIN EVERY 4 WEEKS AS DIRECTED   topiramate  50 MG tablet Commonly known as: TOPAMAX  Take 50 mg by mouth 2 (two) times daily.   Trospium  Chloride 60 MG Cp24 Take 1 capsule (60 mg total) by mouth daily.   valACYclovir 500 MG tablet Commonly known as:  VALTREX Take by mouth as needed (fever blisters).        Allergies: Allergies[1]  Family History: Family History  Problem Relation Age of Onset   Prostate cancer Father    Migraines Paternal Grandmother    Myasthenia gravis Paternal Grandfather    Brain cancer Paternal Grandfather    High blood pressure Other    Parkinson's disease Neg Hx     Social History:  reports that she has never smoked. She has never used smokeless tobacco. She reports that she does not drink alcohol and does not use drugs.  ROS: All other review of systems were reviewed and are negative except what is noted above in HPI  Physical Exam: BP 112/71   Pulse 69    Constitutional:  Alert and oriented, No acute distress. HEENT: [place] AT, moist mucus membranes.  Trachea midline, no masses. Cardiovascular: No clubbing, cyanosis, or edema. Respiratory: Normal respiratory effort, no increased work of breathing. GI: Abdomen is soft, nontender, nondistended, no abdominal masses GU: No CVA tenderness.  Lymph: No cervical or inguinal lymphadenopathy. Skin: No rashes, bruises or suspicious lesions. Neurologic: Grossly intact, no focal deficits, moving all 4 extremities. Psychiatric: Normal mood and affect.  Laboratory Data: Lab Results  Component Value Date   WBC 12.9 (H) 11/01/2014   HGB 10.9 (L) 11/01/2014   HCT 31.5 (L) 11/01/2014   MCV 88.2 11/01/2014   PLT 197 11/01/2014    Lab Results  Component Value Date   CREATININE 0.62 06/20/2023    No results found for: PSA  No results found for: TESTOSTERONE  No results found for: HGBA1C  Urinalysis    Component Value Date/Time   COLORURINE STRAW (A) 07/16/2017 1130   APPEARANCEUR Clear 04/02/2024 1222   LABSPEC 1.011 07/16/2017 1130   PHURINE 5.0 07/16/2017 1130   GLUCOSEU Negative 04/02/2024 1222   HGBUR SMALL (A) 07/16/2017 1130   BILIRUBINUR Negative 04/02/2024 1222   KETONESUR NEGATIVE 07/16/2017 1130   PROTEINUR Negative 04/02/2024 1222   PROTEINUR NEGATIVE 07/16/2017 1130   NITRITE Negative 04/02/2024 1222   NITRITE NEGATIVE 07/16/2017 1130   LEUKOCYTESUR Negative 04/02/2024 1222    Lab Results  Component Value Date   LABMICR Comment 04/02/2024   WBCUA 0-5 08/15/2023   LABEPIT 0-10 08/15/2023   BACTERIA Moderate (A) 08/15/2023    Pertinent Imaging: *** No results found for this or any previous visit.  No results found for this or any previous visit.  No results found for this or any previous visit.  No results found for this or any previous visit.  No results found for this or any previous visit.  No results found for this or any previous visit.  Results for  orders placed during the hospital encounter of 06/27/23  CT HEMATURIA WORKUP  Narrative CLINICAL DATA:  Gross hematuria.  EXAM: CT ABDOMEN AND PELVIS WITHOUT AND WITH CONTRAST  TECHNIQUE: Multidetector CT imaging of the abdomen and pelvis was performed following the standard protocol before and following the bolus administration of intravenous contrast.  RADIATION DOSE REDUCTION: This exam was performed according to the departmental dose-optimization program which includes automated exposure control, adjustment of the mA and/or kV according to patient size and/or use of iterative reconstruction technique.  CONTRAST:  OMNIPAQUE  IOHEXOL  300 MG/ML  SOLN  COMPARISON:  None Available.  FINDINGS: Lower chest: No acute findings. Heart size normal. No pericardial or pleural effusion. Distal esophagus is grossly unremarkable.  Hepatobiliary: Probable millimetric cyst in the dome of the liver. Liver  is otherwise unremarkable. Cholecystectomy. No unexpected biliary ductal dilatation.  Pancreas: Negative.  Spleen: Negative.  Adrenals/Urinary Tract: Adrenal glands and kidneys are unremarkable. No urinary stones. Ureters are decompressed. No filling defects in the intrarenal collecting systems, ureters or bladder.  Stomach/Bowel: Stomach, small bowel, appendix and colon are unremarkable. Fair amount of stool in the colon suggests constipation.  Vascular/Lymphatic: Vascular structures are unremarkable. No pathologically enlarged lymph nodes.  Reproductive: Hysterectomy.  No adnexal mass.  Other: Small bilateral inguinal hernias contain fat. No free fluid. Mesenteries and peritoneum are unremarkable.  Musculoskeletal: Degenerative changes in the spine. No worrisome lytic or sclerotic lesions.  IMPRESSION: No findings to explain the patient's hematuria.   Electronically Signed By: Newell Eke M.D. On: 06/27/2023 13:06  No results found for this or any previous  visit.   Assessment & Plan:    1. OAB (overactive bladder) (Primary) ***   No follow-ups on file.  Belvie Clara, MD  [facility] [place]      [1]  Allergies Allergen Reactions   Morphine And Codeine Itching   Oxycodone  Itching and Rash   Sulfa Antibiotics Rash   "

## 2024-10-08 NOTE — Patient Instructions (Signed)

## 2024-10-12 ENCOUNTER — Encounter: Payer: Self-pay | Admitting: Urology
